# Patient Record
Sex: Male | Born: 1940 | Race: Black or African American | Hispanic: No | Marital: Married | Smoking: Former smoker
Health system: Southern US, Community
[De-identification: ages and names within clinical notes are randomized; demographics above are authoritative.]

## PROBLEM LIST (undated history)

## (undated) DIAGNOSIS — I509 Heart failure, unspecified: Secondary | ICD-10-CM

## (undated) DIAGNOSIS — I10 Essential (primary) hypertension: Secondary | ICD-10-CM

## (undated) DIAGNOSIS — N289 Disorder of kidney and ureter, unspecified: Secondary | ICD-10-CM

## (undated) DIAGNOSIS — I340 Nonrheumatic mitral (valve) insufficiency: Secondary | ICD-10-CM

## (undated) DIAGNOSIS — J449 Chronic obstructive pulmonary disease, unspecified: Secondary | ICD-10-CM

## (undated) DIAGNOSIS — Z951 Presence of aortocoronary bypass graft: Secondary | ICD-10-CM

## (undated) DIAGNOSIS — E119 Type 2 diabetes mellitus without complications: Secondary | ICD-10-CM

## (undated) DIAGNOSIS — E785 Hyperlipidemia, unspecified: Secondary | ICD-10-CM

## (undated) HISTORY — PX: AORTIC VALVE REPLACEMENT (AVR)/CORONARY ARTERY BYPASS GRAFTING (CABG): SHX5725

---
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
## 2001-02-06 DIAGNOSIS — Z951 Presence of aortocoronary bypass graft: Secondary | ICD-10-CM

## 2001-02-06 HISTORY — DX: Presence of aortocoronary bypass graft: Z95.1

## 2012-05-19 ENCOUNTER — Emergency Department: Payer: Self-pay | Admitting: Emergency Medicine

## 2012-05-19 LAB — CBC
MCH: 24.8 pg — ABNORMAL LOW (ref 26.0–34.0)
MCHC: 30.9 g/dL — ABNORMAL LOW (ref 32.0–36.0)
MCV: 80 fL (ref 80–100)
RBC: 4.69 10*6/uL (ref 4.40–5.90)

## 2012-05-19 LAB — BASIC METABOLIC PANEL
Anion Gap: 3 — ABNORMAL LOW (ref 7–16)
BUN: 14 mg/dL (ref 7–18)
Calcium, Total: 8.3 mg/dL — ABNORMAL LOW (ref 8.5–10.1)
Chloride: 108 mmol/L — ABNORMAL HIGH (ref 98–107)
Co2: 28 mmol/L (ref 21–32)
Creatinine: 0.97 mg/dL (ref 0.60–1.30)
EGFR (Non-African Amer.): >60
Glucose: 99 mg/dL (ref 65–99)
Osmolality: 278 (ref 275–301)

## 2012-05-19 LAB — PRO B NATRIURETIC PEPTIDE: B-Type Natriuretic Peptide: 1580 pg/mL — ABNORMAL HIGH (ref 0–125)

## 2012-05-19 LAB — CK TOTAL AND CKMB (NOT AT ARMC): CK-MB: <0.5 ng/mL — ABNORMAL LOW (ref 0.5–3.6)

## 2012-05-19 LAB — TROPONIN I
Troponin-I: <0.02 ng/mL
Troponin-I: 0.03 ng/mL

## 2012-05-24 LAB — CULTURE, BLOOD (SINGLE)

## 2017-06-11 DIAGNOSIS — I509 Heart failure, unspecified: Secondary | ICD-10-CM | POA: Insufficient documentation

## 2017-06-11 DIAGNOSIS — Z95810 Presence of automatic (implantable) cardiac defibrillator: Secondary | ICD-10-CM | POA: Insufficient documentation

## 2017-06-11 DIAGNOSIS — E119 Type 2 diabetes mellitus without complications: Secondary | ICD-10-CM | POA: Insufficient documentation

## 2017-06-11 DIAGNOSIS — I10 Essential (primary) hypertension: Secondary | ICD-10-CM | POA: Insufficient documentation

## 2017-06-11 DIAGNOSIS — R55 Syncope and collapse: Secondary | ICD-10-CM | POA: Insufficient documentation

## 2017-06-11 DIAGNOSIS — I951 Orthostatic hypotension: Secondary | ICD-10-CM | POA: Insufficient documentation

## 2017-09-13 ENCOUNTER — Other Ambulatory Visit: Payer: Self-pay

## 2017-09-13 ENCOUNTER — Emergency Department: Payer: No Typology Code available for payment source

## 2017-09-13 ENCOUNTER — Encounter: Payer: No Typology Code available for payment source | Attending: Internal Medicine | Admitting: *Deleted

## 2017-09-13 ENCOUNTER — Emergency Department
Admission: EM | Admit: 2017-09-13 | Discharge: 2017-09-13 | Disposition: A | Discharge status: Home / Self Care | Payer: No Typology Code available for payment source | Attending: Emergency Medicine | Admitting: Emergency Medicine

## 2017-09-13 ENCOUNTER — Encounter: Payer: Self-pay | Admitting: *Deleted

## 2017-09-13 VITALS — Ht 70.0 in | Wt 312.3 lb

## 2017-09-13 DIAGNOSIS — Z79899 Other long term (current) drug therapy: Secondary | ICD-10-CM | POA: Insufficient documentation

## 2017-09-13 DIAGNOSIS — Z794 Long term (current) use of insulin: Secondary | ICD-10-CM | POA: Diagnosis not present

## 2017-09-13 DIAGNOSIS — I509 Heart failure, unspecified: Secondary | ICD-10-CM | POA: Insufficient documentation

## 2017-09-13 DIAGNOSIS — Z7982 Long term (current) use of aspirin: Secondary | ICD-10-CM | POA: Insufficient documentation

## 2017-09-13 DIAGNOSIS — R000 Tachycardia, unspecified: Secondary | ICD-10-CM | POA: Diagnosis present

## 2017-09-13 DIAGNOSIS — E876 Hypokalemia: Secondary | ICD-10-CM | POA: Insufficient documentation

## 2017-09-13 DIAGNOSIS — G4733 Obstructive sleep apnea (adult) (pediatric): Secondary | ICD-10-CM

## 2017-09-13 DIAGNOSIS — I5022 Chronic systolic (congestive) heart failure: Secondary | ICD-10-CM

## 2017-09-13 DIAGNOSIS — Z87891 Personal history of nicotine dependence: Secondary | ICD-10-CM | POA: Diagnosis not present

## 2017-09-13 DIAGNOSIS — I472 Ventricular tachycardia: Secondary | ICD-10-CM | POA: Insufficient documentation

## 2017-09-13 DIAGNOSIS — N182 Chronic kidney disease, stage 2 (mild): Secondary | ICD-10-CM | POA: Insufficient documentation

## 2017-09-13 DIAGNOSIS — E1122 Type 2 diabetes mellitus with diabetic chronic kidney disease: Secondary | ICD-10-CM | POA: Diagnosis not present

## 2017-09-13 DIAGNOSIS — E785 Hyperlipidemia, unspecified: Secondary | ICD-10-CM

## 2017-09-13 DIAGNOSIS — I130 Hypertensive heart and chronic kidney disease with heart failure and stage 1 through stage 4 chronic kidney disease, or unspecified chronic kidney disease: Secondary | ICD-10-CM | POA: Insufficient documentation

## 2017-09-13 DIAGNOSIS — Z95810 Presence of automatic (implantable) cardiac defibrillator: Secondary | ICD-10-CM | POA: Insufficient documentation

## 2017-09-13 LAB — COMPREHENSIVE METABOLIC PANEL
ALT: 10 U/L (ref 0–44)
AST: 19 U/L (ref 15–41)
Albumin: 3.6 g/dL (ref 3.5–5.0)
Alkaline Phosphatase: 115 U/L (ref 38–126)
Anion gap: 9 (ref 5–15)
BUN: 39 mg/dL — AB (ref 8–23)
CHLORIDE: 92 mmol/L — AB (ref 98–111)
CO2: 34 mmol/L — AB (ref 22–32)
Calcium: 9.3 mg/dL (ref 8.9–10.3)
Creatinine, Ser: 2.33 mg/dL — ABNORMAL HIGH (ref 0.61–1.24)
GFR calc Af Amer: 29 mL/min — ABNORMAL LOW (ref >60)
GFR calc non Af Amer: 25 mL/min — ABNORMAL LOW (ref >60)
Glucose, Bld: 163 mg/dL — ABNORMAL HIGH (ref 70–99)
POTASSIUM: 3.2 mmol/L — AB (ref 3.5–5.1)
SODIUM: 135 mmol/L (ref 135–145)
Total Bilirubin: 0.6 mg/dL (ref 0.3–1.2)
Total Protein: 7.9 g/dL (ref 6.5–8.1)

## 2017-09-13 LAB — CBC WITH DIFFERENTIAL/PLATELET
Basophils Absolute: 0.1 10*3/uL (ref 0–0.1)
Basophils Relative: 1 %
EOS PCT: 2 %
Eosinophils Absolute: 0.2 10*3/uL (ref 0–0.7)
HCT: 36 % — ABNORMAL LOW (ref 40.0–52.0)
HEMOGLOBIN: 11.6 g/dL — AB (ref 13.0–18.0)
LYMPHS ABS: 2.5 10*3/uL (ref 1.0–3.6)
LYMPHS PCT: 23 %
MCH: 25.6 pg — AB (ref 26.0–34.0)
MCHC: 32.4 g/dL (ref 32.0–36.0)
MCV: 79 fL — AB (ref 80.0–100.0)
MONOS PCT: 6 %
Monocytes Absolute: 0.6 10*3/uL (ref 0.2–1.0)
NEUTROS PCT: 68 %
Neutro Abs: 7.6 10*3/uL — ABNORMAL HIGH (ref 1.4–6.5)
Platelets: 226 10*3/uL (ref 150–440)
RBC: 4.55 MIL/uL (ref 4.40–5.90)
RDW: 17.7 % — ABNORMAL HIGH (ref 11.5–14.5)
WBC: 11 10*3/uL — AB (ref 3.8–10.6)

## 2017-09-13 LAB — MAGNESIUM: Magnesium: 2.1 mg/dL (ref 1.7–2.4)

## 2017-09-13 LAB — TROPONIN I
TROPONIN I: 0.03 ng/mL — AB (ref <0.03)
TROPONIN I: 0.03 ng/mL — AB (ref <0.03)

## 2017-09-13 MED ORDER — AMIODARONE LOAD VIA INFUSION: 150.0000 mg | Freq: Once | INTRAVENOUS | Status: DC

## 2017-09-13 MED ORDER — MAGNESIUM SULFATE 2 GM/50ML IV SOLN
2.0000 g | Freq: Once | INTRAVENOUS | Status: AC
  Administered 2017-09-13: 2 g via INTRAVENOUS
  Filled 2017-09-13: qty 50

## 2017-09-13 MED ORDER — POTASSIUM CHLORIDE 10 MEQ/100ML IV SOLN
10.0000 meq | Freq: Once | INTRAVENOUS | Status: AC
  Administered 2017-09-13: 10 meq via INTRAVENOUS
  Filled 2017-09-13: qty 100

## 2017-09-13 MED ORDER — AMIODARONE HCL IN DEXTROSE 360-4.14 MG/200ML-% IV SOLN: 30.0000 mg/h | INTRAVENOUS | Status: DC

## 2017-09-13 MED ORDER — AMIODARONE HCL IN DEXTROSE 360-4.14 MG/200ML-% IV SOLN: 60.0000 mg/h | INTRAVENOUS | Status: DC

## 2017-09-13 MED ORDER — POTASSIUM CHLORIDE CRYS ER 20 MEQ PO TBCR
40.0000 meq | EXTENDED_RELEASE_TABLET | Freq: Once | ORAL | Status: AC
  Administered 2017-09-13: 40 meq via ORAL
  Filled 2017-09-13: qty 2

## 2017-09-13 MED ORDER — PROCAINAMIDE HCL 100 MG/ML IJ SOLN
1400.0000 mg | Freq: Once | INTRAVENOUS | Status: DC
  Filled 2017-09-13: qty 14

## 2017-09-13 NOTE — ED Notes (Signed)
Pt has a [employer] device

## 2017-09-13 NOTE — ED Notes (Signed)
Nurse started Mag at 263ml/hr per Dr. Alfred Levins. Pt in NAD at this time. Cardiac monitor in place.

## 2017-09-13 NOTE — Discharge Instructions (Addendum)
Return to the ER immediately for new, worsening, recurrent palpitations, chest pain, difficulty breathing, weakness or lightheadedness, or any other new or worsening symptoms that concern you.  You may also return at any time if you change your mind and wish to resume your treatment.  Follow-up with Dr. Humphrey Rolls tomorrow at 9 AM as planned.

## 2017-09-13 NOTE — ED Provider Notes (Signed)
[facility] Emergency Department Provider Note  ____________________________________________  Time seen: Approximately 2:49 PM  I have reviewed the triage vital signs and the nursing notes.   HISTORY  Chief Complaint Tachycardia   HPI Trevor Jennings is a 77 y.o. male with history of CHF, paroxysmal V. tach status post pacemaker/AICD, hypertension, hyperlipidemia, diabetes, OSA, CKD (creatinine baseline 2.5) who presents from cardiac rehab for tachycardia.  Patient has not taken his medications this morning.  He went to cardiac rehab and after walking 1.5 minutes on the treadmill became severely tachycardic.  He rested for several minutes with no resolution of his tachycardia and was sent to the emergency room for evaluation.  Patient denies palpitations, chest pain, dizziness, shortness of breath.  He reports feeling at baseline at this time. Has not taken meds today. Weight at baseline.   Patient Active Problem List   Diagnosis Date Noted  . Hyperlipidemia 09/13/2017  . Obstructive sleep apnea 09/13/2017  . CHF (congestive heart failure) ([place]) 06/11/2017  . Diabetes mellitus ([place]) 06/11/2017  . Hypertension 06/11/2017  . ICD (implantable cardioverter-defibrillator) in place 06/11/2017  . Orthostatic hypotension 06/11/2017  . Syncope 06/11/2017    No past surgical history on file.  Prior to Admission medications   Medication Sig Start Date End Date Taking? Authorizing Provider  aspirin EC 81 MG tablet Take by mouth.    [provider]  atorvastatin (LIPITOR) 80 MG tablet Take by mouth.    [provider]  carvedilol (COREG) 25 MG tablet Take by mouth.    [provider]  citalopram (CELEXA) 40 MG tablet Take by mouth.    [provider]  fluticasone (FLONASE) 50 MCG/ACT nasal spray 2 sprays by Each Nare route daily.    [provider]  gabapentin (NEURONTIN) 100 MG capsule Take by mouth.    [provider]  insulin glargine (LANTUS) 100 UNIT/ML injection Inject into the skin.    [provider]  insulin regular (NOVOLIN R,HUMULIN R) 100 units/mL injection Inject into the skin.    [provider]  loratadine (CLARITIN) 10 MG tablet Take 10 mg by mouth daily.    [provider]  pantoprazole (PROTONIX) 40 MG tablet Take by mouth.    [provider]  torsemide (DEMADEX) 20 MG tablet Take by mouth.    [provider]    Allergies Penicillins  No family history on file.  Social History Social History   Tobacco Use  . Smoking status: Former Smoker    Last attempt to quit: 1995    Years since quitting: 24.6  Substance Use Topics  . Alcohol use: Not on file  . Drug use: Not on file    Review of Systems  Constitutional: Negative for fever. Eyes: Negative for visual changes. ENT: Negative for sore throat. Neck: No neck pain  Cardiovascular: Negative for chest pain. + tachycardia Respiratory: Negative for shortness of breath. Gastrointestinal: Negative for abdominal pain, vomiting or diarrhea. Genitourinary: Negative for dysuria. Musculoskeletal: Negative for back pain. Skin: Negative for rash. Neurological: Negative for headaches, weakness or numbness. Psych: No SI or HI  ____________________________________________   PHYSICAL EXAM:  VITAL SIGNS: ED Triage Vitals  Enc Vitals Group     BP 09/13/17 1422 129/67     Pulse Rate 09/13/17 1422 (!) 135     Resp 09/13/17 1422 20     Temp 09/13/17 1422 98.1 F (36.7 C)     Temp Source 09/13/17 1422 Oral  SpO2 09/13/17 1422 98 %     Weight 09/13/17 1423 214 lb (97.1 kg)     Height 09/13/17 1423 5\' 10"  (1.778 m)     Head Circumference --      Peak Flow --      Pain Score 09/13/17 1423 0     Pain Loc --      Pain Edu? --      Excl. in [place]? --     Constitutional: Alert and oriented. Well appearing and in no apparent distress. HEENT:      Head: Normocephalic and  atraumatic.         Eyes: Conjunctivae are normal. Sclera is non-icteric.       Mouth/Throat: Mucous membranes are moist.       Neck: Supple with no signs of meningismus. Cardiovascular: Tachycardic with regular rate. No murmurs, gallops, or rubs. 2+ symmetrical distal pulses are present in all extremities. No JVD. Respiratory: Normal respiratory effort. Lungs are clear to auscultation bilaterally. No wheezes, crackles, or rhonchi.  Gastrointestinal: Soft, non tender, and non distended with positive bowel sounds. No rebound or guarding. Musculoskeletal: 2+ pitting edema b/l  Neurologic: Normal speech and language. Face is symmetric. Moving all extremities. No gross focal neurologic deficits are appreciated. Skin: Skin is warm, dry and intact. No rash noted. Psychiatric: Mood and affect are normal. Speech and behavior are normal.  ____________________________________________   LABS (all labs ordered are listed, but only abnormal results are displayed)  Labs Reviewed  TROPONIN I - Abnormal; Notable for the following components:      Result Value   Troponin I 0.03 (*)    All other components within normal limits  COMPREHENSIVE METABOLIC PANEL - Abnormal; Notable for the following components:   Potassium 3.2 (*)    Chloride 92 (*)    CO2 34 (*)    Glucose, Bld 163 (*)    BUN 39 (*)    Creatinine, Ser 2.33 (*)    GFR calc non Af Amer 25 (*)    GFR calc Af Amer 29 (*)    All other components within normal limits  CBC WITH DIFFERENTIAL/PLATELET - Abnormal; Notable for the following components:   WBC 11.0 (*)    Hemoglobin 11.6 (*)    HCT 36.0 (*)    MCV 79.0 (*)    MCH 25.6 (*)    RDW 17.7 (*)    Neutro Abs 7.6 (*)    All other components within normal limits  MAGNESIUM   ____________________________________________  EKG  ED ECG REPORT I, Rudene Re, the attending physician, personally viewed and interpreted this ECG.  Wide regular complex tachycardia, rate of 135,  prolonged QTC at 627, right bundle branch block, no ST elevations or depressions.  No prior for comparison.  14:59 -atrial sensed ventricular paced complex, rate of 89, prolonged QTC, right bundle branch block, no ST elevations or depressions. ____________________________________________  RADIOLOGY  I have personally reviewed the images performed during this visit and I agree with the Radiologist's read.   Interpretation by Radiologist:  Dg Chest Port 1 View  Result Date: 09/13/2017 CLINICAL DATA:  Cardiac arrhythmia EXAM: PORTABLE CHEST 1 VIEW COMPARISON:  May 19, 2012 FINDINGS: There is no appreciable edema or consolidation. There is cardiomegaly with pulmonary vascularity normal. Pacemaker leads are attached to the right atrium, right ventricle, and coronary sinus. No adenopathy. No bone lesions. IMPRESSION: Stable cardiac enlargement. Stable appearing pacemaker lead placements. No edema or consolidation. Electronically Signed   By:  Lowella Grip III M.D.   On: 09/13/2017 15:17    ____________________________________________   PROCEDURES  Procedure(s) performed: None Procedures Critical Care performed:  None ____________________________________________   INITIAL IMPRESSION / ASSESSMENT AND PLAN / ED COURSE  76 y.o. male with history of CHF, paroxysmal V. tach status post pacemaker/AICD, hypertension, hyperlipidemia, diabetes, OSA, CKD (creatinine baseline 2.5) who presents from cardiac rehab for tachycardia.  Patient arrives in the emergency department hemodynamically stable with initial EKG showing wide regular complex tachycardia.  [facility]  According to records that he brought with him patient has a history of nonsustained V. Tach and SVT.  We do not have an old EKG for him.  Patient was started on IV magnesium and converted to his normal rhythm showing a right bundle branch block and a paced rhythm. Since patient seems to have a RBBB at baseline, his initial  EKG could also be consistent with SVT with aberrancy which would explain why patient's defibrillator did not shock him. Pacemaker will be interrogated. Labs showing mild hypokalemia with K of 3.2 which will be supplemented p.o. and IV.  Creatinine of 2.33 which is within patient's baseline of 2.5.  Chest x-ray showed no acute findings.  I discussed patient with Dr. Humphrey Rolls, cardiologist on-call who evaluated patient in the emergency room and recommended outpatient follow-up tomorrow 9 AM in his office as long as patient remains in his normal rhythm and the labs did not show any severe abnormalities. Will get a 2nd troponin at 1740 and reassess. Care transferred to Dr. Cherylann Banas.      As part of my medical decision making, I reviewed the following data within the [place] notes reviewed and incorporated, Labs reviewed , EKG interpreted , Old chart reviewed, Radiograph reviewed , A consult was requested and obtained from this/these consultant(s) cardiology, Notes from prior ED visits and [place] Controlled Substance Database    Pertinent labs & imaging results that were available during my care of the patient were reviewed by me and considered in my medical decision making (see chart for details).    ____________________________________________   FINAL CLINICAL IMPRESSION(S) / ED DIAGNOSES  Final diagnoses:  Wide-complex tachycardia ([place])  Hypokalemia      NEW MEDICATIONS STARTED DURING THIS VISIT:  ED Discharge Orders    None       Note:  This document was prepared using Dragon voice recognition software and may include unintentional dictation errors.    Alfred Levins, [place], MD 09/13/17 [phone]

## 2017-09-13 NOTE — ED Provider Notes (Signed)
-----------------------------------------   6:21 PM on 09/13/2017 -----------------------------------------  I took over care on this patient from Dr. Alfred Levins.  The patient was being evaluated for arrhythmia and tachycardia.  The plan was repeat troponin, and if the patient continued to have a stable heart rate and be asymptomatic, he would be discharged home with follow-up with Dr. Humphrey Rolls tomorrow.  The repeat troponin is stable, and the patient remains asymptomatic, however he did have a recurrence of the tachycardia.  Repeat EKG at this time shows slightly irregular tachycardia in the 130s with slightly wide QRS.  On reassessment, the patient continues to report no symptoms.  I talked to Dr. Humphrey Rolls from cardiology once again.  He recommended to place the patient on amiodarone drip and admit him for observation overnight, with a plan to likely send him home on p.o. amiodarone tomorrow.  However, when I offered this to the patient, he stated that he would strongly prefer to go home.  The patient states that he is asymptomatic and feels fine, and would prefer to go home and follow-up tomorrow.  He would like to leave AMA.  I explained the results of the work-up, the concern for acute arrhythmia, and the risks including but not limited to possible permanent damage to the heart, syncope, death, or permanent disability.  Patient was able to paraphrase these risks back to me and demonstrates appropriate understanding.  He demonstrates full decision-making capacity.  He also understands he may return at any time he changes his mind or has any new or worsening symptoms.  He agrees to follow-up with Dr. Humphrey Rolls tomorrow.  ED ECG REPORT I, Arta Silence, the attending physician, personally viewed and interpreted this ECG.  Date: 09/13/2017 EKG Time: 1738 Rate: 131 Rhythm: Atrial tachycardia QRS Axis: normal Intervals: RBBB ST/T Wave abnormalities: normal Narrative Interpretation: No specific atrial  arrhythmia with RBBB    Arta Silence, MD 09/13/17 1826

## 2017-09-13 NOTE — ED Triage Notes (Signed)
PT arrived from [facility] for uncontrolled heart rate. Pt denies any pain in triage room.

## 2017-09-13 NOTE — Patient Instructions (Signed)
Patient Instructions  Patient Details  Name: Trevor Jennings MRN: [id number] Date of Birth: Jun 30, 1940 Referring Provider:  [facility]  Below are your personal goals for exercise, nutrition, and risk factors. Our goal is to help you stay on track towards obtaining and maintaining these goals. We will be discussing your progress on these goals with you throughout the program.  Initial Exercise Prescription: Initial Exercise Prescription - 09/13/17 1400      Date of Initial Exercise RX and Referring Provider   Date  09/13/17    [facility] Provider  [facility]      Treadmill   MPH  0.8    Grade  0    Minutes  15    METs  1      T5 Nustep   Level  1    SPM  60    Minutes  15      Biostep-RELP   Level  1    SPM  50    Minutes  15    METs  1      Track   Laps  10    Minutes  15    METs  1      Prescription Details   Frequency (times per week)  3    Duration  Progress to 45 minutes of aerobic exercise without signs/symptoms of physical distress      Intensity   THRR 40-80% of Max Heartrate  110-132    Ratings of Perceived Exertion  11-13    Perceived Dyspnea  0-4      Resistance Training   Training Prescription  Yes    Weight  3 lb    Reps  10-15       Exercise Goals: Frequency: Be able to perform aerobic exercise two to three times per week in program working toward 2-5 days per week of home exercise.  Intensity: Work with a perceived exertion of 11 (fairly light) - 15 (hard) while following your exercise prescription.  We will make changes to your prescription with you as you progress through the program.   Duration: Be able to do 30 to 45 minutes of continuous aerobic exercise in addition to a 5 minute warm-up and a 5 minute cool-down routine.   Nutrition Goals: Your personal nutrition goals will be established when you do your nutrition analysis with the dietician.  The following are general nutrition guidelines to follow: Cholesterol < 200mg /day Sodium <  1500mg /day Fiber: Men over 50 yrs - 30 grams per day  Personal Goals: Personal Goals and Risk Factors at Admission - 09/13/17 1446      Core Components/Risk Factors/Patient Goals on Admission    Weight Management  Yes;Obesity;Weight Loss    Intervention  Weight Management: Develop a combined nutrition and exercise program designed to reach desired caloric intake, while maintaining appropriate intake of nutrient and fiber, sodium and fats, and appropriate energy expenditure required for the weight goal.;Weight Management: Provide education and appropriate resources to help participant work on and attain dietary goals.;Weight Management/Obesity: Establish reasonable short term and long term weight goals.;Obesity: Provide education and appropriate resources to help participant work on and attain dietary goals.    Admit Weight  312 lb (141.5 kg)    Goal Weight: Short Term  308 lb (139.7 kg)    Goal Weight: Long Term  270 lb (122.5 kg)    Expected Outcomes  Short Term: Continue to assess and modify interventions until short term weight is achieved;Long Term: Adherence to  nutrition and physical activity/exercise program aimed toward attainment of established weight goal;Weight Loss: Understanding of general recommendations for a balanced deficit meal plan, which promotes 1-2 lb weight loss per week and includes a negative energy balance of [phone] kcal/d;Understanding recommendations for meals to include 15-35% energy as protein, 25-35% energy from fat, 35-60% energy from carbohydrates, less than 200mg  of dietary cholesterol, 20-35 gm of total fiber daily;Understanding of distribution of calorie intake throughout the day with the consumption of 4-5 meals/snacks    Diabetes  Yes    Intervention  Provide education about signs/symptoms and action to take for hypo/hyperglycemia.;Provide education about proper nutrition, including hydration, and aerobic/resistive exercise prescription along with prescribed  medications to achieve blood glucose in normal ranges: Fasting glucose 65-99 mg/dL    Expected Outcomes  Short Term: Participant verbalizes understanding of the signs/symptoms and immediate care of hyper/hypoglycemia, proper foot care and importance of medication, aerobic/resistive exercise and nutrition plan for blood glucose control.;Long Term: Attainment of HbA1C < 7%.    Heart Failure  Yes    Intervention  Provide a combined exercise and nutrition program that is supplemented with education, support and counseling about heart failure. Directed toward relieving symptoms such as shortness of breath, decreased exercise tolerance, and extremity edema.    Expected Outcomes  Improve functional capacity of life;Short term: Attendance in program 2-3 days a week with increased exercise capacity. Reported lower sodium intake. Reported increased fruit and vegetable intake. Reports medication compliance.;Short term: Daily weights obtained and reported for increase. Utilizing diuretic protocols set by physician.;Long term: Adoption of self-care skills and reduction of barriers for early signs and symptoms recognition and intervention leading to self-care maintenance.    Hypertension  Yes    Intervention  Provide education on lifestyle modifcations including regular physical activity/exercise, weight management, moderate sodium restriction and increased consumption of fresh fruit, vegetables, and low fat dairy, alcohol moderation, and smoking cessation.;Monitor prescription use compliance.    Expected Outcomes  Short Term: Continued assessment and intervention until BP is < 140/42mm HG in hypertensive participants. < 130/79mm HG in hypertensive participants with diabetes, heart failure or chronic kidney disease.;Long Term: Maintenance of blood pressure at goal levels.    Lipids  Yes    Intervention  Provide education and support for participant on nutrition & aerobic/resistive exercise along with prescribed  medications to achieve LDL 70mg , HDL >40mg .    Expected Outcomes  Short Term: Participant states understanding of desired cholesterol values and is compliant with medications prescribed. Participant is following exercise prescription and nutrition guidelines.;Long Term: Cholesterol controlled with medications as prescribed, with individualized exercise RX and with personalized nutrition plan. Value goals: LDL < 70mg , HDL > 40 mg.       Tobacco Use Initial Evaluation: Social History   Tobacco Use  Smoking Status Former Smoker  . Last attempt to quit: 1995  . Years since quitting: 24.6    Exercise Goals and Review: Exercise Goals    Row Name 09/13/17 1425             Exercise Goals   Increase Physical Activity  Yes       Intervention  Provide advice, education, support and counseling about physical activity/exercise needs.;Develop an individualized exercise prescription for aerobic and resistive training based on initial evaluation findings, risk stratification, comorbidities and participant's personal goals.       Expected Outcomes  Short Term: Attend rehab on a regular basis to increase amount of physical activity.;Long Term: Add in home exercise to  make exercise part of routine and to increase amount of physical activity.;Long Term: Exercising regularly at least 3-5 days a week.       Increase Strength and Stamina  Yes       Intervention  Provide advice, education, support and counseling about physical activity/exercise needs.;Develop an individualized exercise prescription for aerobic and resistive training based on initial evaluation findings, risk stratification, comorbidities and participant's personal goals.       Expected Outcomes  Short Term: Increase workloads from initial exercise prescription for resistance, speed, and METs.;Short Term: Perform resistance training exercises routinely during rehab and add in resistance training at home;Long Term: Improve cardiorespiratory  fitness, muscular endurance and strength as measured by increased METs and functional capacity (6MWT)       Able to understand and use rate of perceived exertion (RPE) scale  Yes       Intervention  Provide education and explanation on how to use RPE scale       Expected Outcomes  Short Term: Able to use RPE daily in rehab to express subjective intensity level;Long Term:  Able to use RPE to guide intensity level when exercising independently       Able to understand and use Dyspnea scale  Yes       Intervention  Provide education and explanation on how to use Dyspnea scale       Expected Outcomes  Short Term: Able to use Dyspnea scale daily in rehab to express subjective sense of shortness of breath during exertion;Long Term: Able to use Dyspnea scale to guide intensity level when exercising independently       Knowledge and understanding of Target Heart Rate Range (THRR)  Yes       Intervention  Provide education and explanation of THRR including how the numbers were predicted and where they are located for reference       Expected Outcomes  Short Term: Able to state/look up THRR;Long Term: Able to use THRR to govern intensity when exercising independently;Short Term: Able to use daily as guideline for intensity in rehab       Able to check pulse independently  Yes       Intervention  Provide education and demonstration on how to check pulse in carotid and radial arteries.;Review the importance of being able to check your own pulse for safety during independent exercise       Expected Outcomes  Short Term: Able to explain why pulse checking is important during independent exercise;Long Term: Able to check pulse independently and accurately       Understanding of Exercise Prescription  Yes       Intervention  Provide education, explanation, and written materials on patient's individual exercise prescription       Expected Outcomes  Short Term: Able to explain program exercise prescription;Long Term:  Able to explain home exercise prescription to exercise independently          Copy of goals given to participant.

## 2017-09-13 NOTE — Progress Notes (Signed)
Daily Session Note  Patient Details  Name: Trevor Jennings MRN: [id number] Date of Birth: November 20, 1940 Referring Provider:     Cardiac Rehab from 09/13/2017 in [facility]  [facility]  [facility]      Encounter Date: 09/13/2017  Check In: Session Check In - 09/13/17 1439      Check-In   Supervising physician immediately available to respond to emergencies  See telemetry face sheet for immediately available ER MD    Location  [facility]-Cardiac & Pulmonary Rehab    Staff Present  Nada Maclachlan, BA, ACSM CEP, Exercise Physiologist;Kassondra Geil Sherryll Burger, RN BSN    Tobacco Cessation  No Change    Warm-up and Cool-down  Not performed (comment)    Resistance Training Performed  Yes    VAD Patient?  No    PAD/SET Patient?  No      Pain Assessment   Currently in Pain?  No/denies        Exercise Prescription Changes - 09/13/17 1400      Response to Exercise   Blood Pressure (Admit)  110/64    Blood Pressure (Exercise)  124/60    Blood Pressure (Exit)  110/56    Heart Rate (Admit)  81 bpm    Heart Rate (Exercise)  144 bpm    Heart Rate (Exit)  133 bpm    Oxygen Saturation (Admit)  96 %    Oxygen Saturation (Exercise)  98 %    Oxygen Saturation (Exit)  97 %    Rating of Perceived Exertion (Exercise)  13    Perceived Dyspnea (Exercise)  2       Social History   Tobacco Use  Smoking Status Former Smoker  . Last attempt to quit: 1995  . Years since quitting: 24.6    Goals Met:  Proper associated with RPD/PD & O2 Sat Personal goals reviewed Strength training completed today  Goals Unmet:  Not Applicable  Comments: Med Review completed.    Dr. Emily Filbert is Medical Director for [facility] and [facility].

## 2017-09-13 NOTE — ED Notes (Signed)
2nd Troponin sent to lab.

## 2017-09-13 NOTE — Consult Note (Signed)
Quan Cybulski is a 77 y.o. male  [id number]  Primary Cardiologist: Oney Folz Reason for Consultation: wide complex tachycardia  HPI: [id number] came for evaluation sent from cardiac rehab as had Hear rate 135. He was given Magnesium IV and now is pacing atrial sensed at 70/min.   Review of Systems: Denis chest pain or SOB.   No past medical history on file.   (Not in a hospital admission)   . potassium chloride  40 mEq Oral Once    Infusions: . potassium chloride    . procainamide (PRONESTYL) BOLUS IVPB      Allergies  Allergen Reactions  . Penicillins     Social History   Socioeconomic History  . Marital status: Married    Spouse name: Not on file  . Number of children: Not on file  . Years of education: Not on file  . Highest education level: Not on file  Occupational History  . Not on file  Social Needs  . Financial resource strain: Not on file  . Food insecurity:    Worry: Not on file    Inability: Not on file  . Transportation needs:    Medical: Not on file    Non-medical: Not on file  Tobacco Use  . Smoking status: Former Smoker    Last attempt to quit: 1995    Years since quitting: 24.6  Substance and Sexual Activity  . Alcohol use: Not on file  . Drug use: Not on file  . Sexual activity: Not on file  Lifestyle  . Physical activity:    Days per week: Not on file    Minutes per session: Not on file  . Stress: Not on file  Relationships  . Social connections:    Talks on phone: Not on file    Gets together: Not on file    Attends religious service: Not on file    Active member of club or organization: Not on file    Attends meetings of clubs or organizations: Not on file    Relationship status: Not on file  . Intimate partner violence:    Fear of current or ex partner: Not on file    Emotionally abused: Not on file    Physically abused: Not on file    Forced sexual activity: Not on file  Other Topics Concern  . Not on file  Social  History Narrative  . Not on file    No family history on file.  PHYSICAL EXAM: Vitals:   09/13/17 1500 09/13/17 1530  BP: 115/64 121/78  Pulse: 79 79  Resp: (!) 8 18  Temp:    SpO2: 99% (!) 87%    No intake or output data in the 24 hours ending 09/13/17 1540  General:  Well appearing. No respiratory difficulty HEENT: normal Neck: supple. no JVD. Carotids 2+ bilat; no bruits. No lymphadenopathy or thryomegaly appreciated. Cor: PMI nondisplaced. Regular rate & rhythm. No rubs, gallops or murmurs. Lungs: clear Abdomen: soft, nontender, nondistended. No hepatosplenomegaly. No bruits or masses. Good bowel sounds. Extremities: no cyanosis, clubbing, rash, edema Neuro: alert & oriented x 3, cranial nerves grossly intact. moves all 4 extremities w/o difficulty. Affect pleasant.  ECG: wide complex rhythm 135/min appears to be probably vtach  Results for orders placed or performed during the hospital encounter of 09/13/17 (from the past 24 hour(s))  Troponin I     Status: Abnormal   Collection Time: 09/13/17  2:41 PM  Result Value Ref Range  Troponin I 0.03 (HH) <0.03 ng/mL  Comprehensive metabolic panel     Status: Abnormal   Collection Time: 09/13/17  2:41 PM  Result Value Ref Range   Sodium 135 135 - 145 mmol/L   Potassium 3.2 (L) 3.5 - 5.1 mmol/L   Chloride 92 (L) 98 - 111 mmol/L   CO2 34 (H) 22 - 32 mmol/L   Glucose, Bld 163 (H) 70 - 99 mg/dL   BUN 39 (H) 8 - 23 mg/dL   Creatinine, Ser 2.33 (H) 0.61 - 1.24 mg/dL   Calcium 9.3 8.9 - 10.3 mg/dL   Total Protein 7.9 6.5 - 8.1 g/dL   Albumin 3.6 3.5 - 5.0 g/dL   AST 19 15 - 41 U/L   ALT 10 0 - 44 U/L   Alkaline Phosphatase 115 38 - 126 U/L   Total Bilirubin 0.6 0.3 - 1.2 mg/dL   GFR calc non Af Amer 25 (L) >60 mL/min   GFR calc Af Amer 29 (L) >60 mL/min   Anion gap 9 5 - 15  CBC with Differential     Status: Abnormal   Collection Time: 09/13/17  2:41 PM  Result Value Ref Range   WBC 11.0 (H) 3.8 - 10.6 K/uL   RBC 4.55  4.40 - 5.90 MIL/uL   Hemoglobin 11.6 (L) 13.0 - 18.0 g/dL   HCT 36.0 (L) 40.0 - 52.0 %   MCV 79.0 (L) 80.0 - 100.0 fL   MCH 25.6 (L) 26.0 - 34.0 pg   MCHC 32.4 32.0 - 36.0 g/dL   RDW 17.7 (H) 11.5 - 14.5 %   Platelets 226 150 - 440 K/uL   Neutrophils Relative % 68 %   Neutro Abs 7.6 (H) 1.4 - 6.5 K/uL   Lymphocytes Relative 23 %   Lymphs Abs 2.5 1.0 - 3.6 K/uL   Monocytes Relative 6 %   Monocytes Absolute 0.6 0.2 - 1.0 K/uL   Eosinophils Relative 2 %   Eosinophils Absolute 0.2 0 - 0.7 K/uL   Basophils Relative 1 %   Basophils Absolute 0.1 0 - 0.1 K/uL   Dg Chest Port 1 View  Result Date: 09/13/2017 CLINICAL DATA:  Cardiac arrhythmia EXAM: PORTABLE CHEST 1 VIEW COMPARISON:  May 19, 2012 FINDINGS: There is no appreciable edema or consolidation. There is cardiomegaly with pulmonary vascularity normal. Pacemaker leads are attached to the right atrium, right ventricle, and coronary sinus. No adenopathy. No bone lesions. IMPRESSION: Stable cardiac enlargement. Stable appearing pacemaker lead placements. No edema or consolidation. Electronically Signed   By: Lowella Grip III M.D.   On: 09/13/2017 15:17     ASSESSMENT AND PLAN: Has wide complex rhythm, but has PPM and AICD, and can go home with f/u tomorrow at 9 am since is asymptomatic.  Ketra Duchesne A

## 2017-09-13 NOTE — Progress Notes (Signed)
Cardiac Individual Treatment Plan  Patient Details  Name: Trevor Jennings MRN: [id number] Date of Birth: 25-Feb-1940 Referring Provider:     Cardiac Rehab from 09/13/2017 in [facility]  [facility]      Initial Encounter Date:    Cardiac Rehab from 09/13/2017 in [facility] Cardiac and Pulmonary Rehab  Date  09/13/17      Visit Diagnosis: Heart failure, chronic systolic (HCC)  Hyperlipidemia, unspecified hyperlipidemia type  Obstructive sleep apnea  Patient's Home Medications on Admission: No current facility-administered medications for this visit.   Current Outpatient Medications:  .  aspirin EC 81 MG tablet, Take by mouth., Disp: , Rfl:  .  atorvastatin (LIPITOR) 80 MG tablet, Take by mouth., Disp: , Rfl:  .  carvedilol (COREG) 25 MG tablet, Take by mouth., Disp: , Rfl:  .  citalopram (CELEXA) 40 MG tablet, Take by mouth., Disp: , Rfl:  .  fluticasone (FLONASE) 50 MCG/ACT nasal spray, 2 sprays by Each Nare route daily., Disp: , Rfl:  .  gabapentin (NEURONTIN) 100 MG capsule, Take by mouth., Disp: , Rfl:  .  insulin glargine (LANTUS) 100 UNIT/ML injection, Inject into the skin., Disp: , Rfl:  .  insulin regular (NOVOLIN R,HUMULIN R) 100 units/mL injection, Inject into the skin., Disp: , Rfl:  .  loratadine (CLARITIN) 10 MG tablet, Take 10 mg by mouth daily., Disp: , Rfl:  .  pantoprazole (PROTONIX) 40 MG tablet, Take by mouth., Disp: , Rfl:  .  torsemide (DEMADEX) 20 MG tablet, Take by mouth., Disp: , Rfl:   Facility-Administered Medications Ordered in Other Visits:  .  magnesium sulfate IVPB 2 g 50 mL, 2 g, Intravenous, Once, Alfred Levins, [place], MD, Last Rate: 50 mL/hr at 09/13/17 1449, 2 g at 09/13/17 1449 .  procainamide (PRONESTYL) 1,400 mg in dextrose 5 % 350 mL IVPB, 1,400 mg, Intravenous, Once, Alfred Levins, [place], MD  Past Medical History: History reviewed. No pertinent past medical history.  Tobacco Use: Social History   Tobacco Use   Smoking Status Former Smoker  . Last attempt to quit: 1995  . Years since quitting: 24.6    Labs: Recent Review Flowsheet Data    There is no flowsheet data to display.       Exercise Target Goals: Date: 09/13/17  Exercise Program Goal: Individual exercise prescription set using results from initial 6 min walk test and THRR while considering  patient's activity barriers and safety.   Exercise Prescription Goal: Initial exercise prescription builds to 30-45 minutes a day of aerobic activity, 2-3 days per week.  Home exercise guidelines will be given to patient during program as part of exercise prescription that the participant will acknowledge.  Activity Barriers & Risk Stratification: Activity Barriers & Cardiac Risk Stratification - 09/13/17 1455      Activity Barriers & Cardiac Risk Stratification   Activity Barriers  Back Problems;Shortness of Breath    Cardiac Risk Stratification  High       6 Minute Walk: 6 Minute Walk    Row Name 09/13/17 1426         6 Minute Walk   Distance  200 feet     Walk Time  2 minutes     MPH  1.13     RPE  13     Perceived Dyspnea   2     Symptoms  Yes (comment)     Comments  short of breath - stopped at 2:00  Resting HR  88 bpm     Resting BP  110/64     Resting Oxygen Saturation   94 %     Exercise Oxygen Saturation  during 6 min walk  97 %     Max Ex. HR  144 bpm     Max Ex. BP  124/60     2 Minute Post BP  110/56        Oxygen Initial Assessment:   Oxygen Re-Evaluation:   Oxygen Discharge (Final Oxygen Re-Evaluation):   Initial Exercise Prescription: Initial Exercise Prescription - 09/13/17 1400      Date of Initial Exercise RX and Referring Provider   Date  09/13/17    [facility]      Treadmill   MPH  0.8    Grade  0    Minutes  15    METs  1      T5 Nustep   Level  1    SPM  60    Minutes  15      Biostep-RELP   Level  1    SPM  50    Minutes  15    METs  1      Track    Laps  10    Minutes  15    METs  1      Prescription Details   Frequency (times per week)  3    Duration  Progress to 45 minutes of aerobic exercise without signs/symptoms of physical distress      Intensity   THRR 40-80% of Max Heartrate  110-132    Ratings of Perceived Exertion  11-13    Perceived Dyspnea  0-4      Resistance Training   Training Prescription  Yes    Weight  3 lb    Reps  10-15       Perform Capillary Blood Glucose checks as needed.  Exercise Prescription Changes: Exercise Prescription Changes    Row Name 09/13/17 1400             Response to Exercise   Blood Pressure (Admit)  110/64       Blood Pressure (Exercise)  124/60       Blood Pressure (Exit)  110/56       Heart Rate (Admit)  81 bpm       Heart Rate (Exercise)  144 bpm       Heart Rate (Exit)  133 bpm       Oxygen Saturation (Admit)  96 %       Oxygen Saturation (Exercise)  98 %       Oxygen Saturation (Exit)  97 %       Rating of Perceived Exertion (Exercise)  13       Perceived Dyspnea (Exercise)  2          Exercise Comments:   Exercise Goals and Review: Exercise Goals    Row Name 09/13/17 1425             Exercise Goals   Increase Physical Activity  Yes       Intervention  Provide advice, education, support and counseling about physical activity/exercise needs.;Develop an individualized exercise prescription for aerobic and resistive training based on initial evaluation findings, risk stratification, comorbidities and participant's personal goals.       Expected Outcomes  Short Term: Attend rehab on a regular basis to increase amount of physical activity.;Long Term: Add in home  exercise to make exercise part of routine and to increase amount of physical activity.;Long Term: Exercising regularly at least 3-5 days a week.       Increase Strength and Stamina  Yes       Intervention  Provide advice, education, support and counseling about physical activity/exercise needs.;Develop  an individualized exercise prescription for aerobic and resistive training based on initial evaluation findings, risk stratification, comorbidities and participant's personal goals.       Expected Outcomes  Short Term: Increase workloads from initial exercise prescription for resistance, speed, and METs.;Short Term: Perform resistance training exercises routinely during rehab and add in resistance training at home;Long Term: Improve cardiorespiratory fitness, muscular endurance and strength as measured by increased METs and functional capacity (6MWT)       Able to understand and use rate of perceived exertion (RPE) scale  Yes       Intervention  Provide education and explanation on how to use RPE scale       Expected Outcomes  Short Term: Able to use RPE daily in rehab to express subjective intensity level;Long Term:  Able to use RPE to guide intensity level when exercising independently       Able to understand and use Dyspnea scale  Yes       Intervention  Provide education and explanation on how to use Dyspnea scale       Expected Outcomes  Short Term: Able to use Dyspnea scale daily in rehab to express subjective sense of shortness of breath during exertion;Long Term: Able to use Dyspnea scale to guide intensity level when exercising independently       Knowledge and understanding of Target Heart Rate Range (THRR)  Yes       Intervention  Provide education and explanation of THRR including how the numbers were predicted and where they are located for reference       Expected Outcomes  Short Term: Able to state/look up THRR;Long Term: Able to use THRR to govern intensity when exercising independently;Short Term: Able to use daily as guideline for intensity in rehab       Able to check pulse independently  Yes       Intervention  Provide education and demonstration on how to check pulse in carotid and radial arteries.;Review the importance of being able to check your own pulse for safety during  independent exercise       Expected Outcomes  Short Term: Able to explain why pulse checking is important during independent exercise;Long Term: Able to check pulse independently and accurately       Understanding of Exercise Prescription  Yes       Intervention  Provide education, explanation, and written materials on patient's individual exercise prescription       Expected Outcomes  Short Term: Able to explain program exercise prescription;Long Term: Able to explain home exercise prescription to exercise independently          Exercise Goals Re-Evaluation :   Discharge Exercise Prescription (Final Exercise Prescription Changes): Exercise Prescription Changes - 09/13/17 1400      Response to Exercise   Blood Pressure (Admit)  110/64    Blood Pressure (Exercise)  124/60    Blood Pressure (Exit)  110/56    Heart Rate (Admit)  81 bpm    Heart Rate (Exercise)  144 bpm    Heart Rate (Exit)  133 bpm    Oxygen Saturation (Admit)  96 %    Oxygen Saturation (Exercise)  98 %    Oxygen Saturation (Exit)  97 %    Rating of Perceived Exertion (Exercise)  13    Perceived Dyspnea (Exercise)  2       Nutrition:  Target Goals: Understanding of nutrition guidelines, daily intake of sodium <1533m, cholesterol <2031m calories 30% from fat and 7% or less from saturated fats, daily to have 5 or more servings of fruits and vegetables.  Biometrics: Pre Biometrics - 09/13/17 1424      Pre Biometrics   Height  _0  (1.778 m)    Weight  (!) 312 lb 4.8 oz (141.7 kg)    Waist Circumference  55 inches    Hip Circumference  54 inches    Waist to Hip Ratio  1.02 %    BMI (Calculated)  44.81        Nutrition Therapy Plan and Nutrition Goals: Nutrition Therapy & Goals - 09/13/17 1445      Intervention Plan   Intervention  Prescribe, educate and counsel regarding individualized specific dietary modifications aiming towards targeted core components such as weight, hypertension, lipid management,  diabetes, heart failure and other comorbidities.;Nutrition handout(s) given to patient.    Expected Outcomes  Short Term Goal: Understand basic principles of dietary content, such as calories, fat, sodium, cholesterol and nutrients.;Long Term Goal: Adherence to prescribed nutrition plan.;Short Term Goal: A plan has been developed with personal nutrition goals set during dietitian appointment.       Nutrition Assessments: Nutrition Assessments - 09/13/17 1445      MEDFICTS Scores   Pre Score  58       Nutrition Goals Re-Evaluation:   Nutrition Goals Discharge (Final Nutrition Goals Re-Evaluation):   Psychosocial: Target Goals: Acknowledge presence or absence of significant depression and/or stress, maximize coping skills, provide positive support system. Participant is able to verbalize types and ability to use techniques and skills needed for reducing stress and depression.   Initial Review & Psychosocial Screening: Initial Psych Review & Screening - 09/13/17 1443      Initial Review   Current issues with  Current Stress Concerns    Source of Stress Concerns  Chronic Illness;Unable to perform yard/household activities;Unable to participate in former interests or hobbies      [place] Yes      Barriers   Psychosocial barriers to participate in program  There are no identifiable barriers or psychosocial needs.;The patient should benefit from training in stress management and relaxation.      Screening Interventions   Interventions  Encouraged to exercise;Program counselor consult;To provide support and resources with identified psychosocial needs;Provide feedback about the scores to participant    Expected Outcomes  Short Term goal: Utilizing psychosocial counselor, staff and physician to assist with identification of specific Stressors or current issues interfering with healing process. Setting desired goal for each stressor or current issue  identified.;Long Term Goal: Stressors or current issues are controlled or eliminated.;Short Term goal: Identification and review with participant of any Quality of Life or Depression concerns found by scoring the questionnaire.;Long Term goal: The participant improves quality of Life and PHQ9 Scores as seen by post scores and/or verbalization of changes       Quality of Life Scores:  Quality of Life - 09/13/17 1444      Quality of Life   Select  Quality of Life      Quality of Life Scores   Health/Function Pre  12.29 %  Socioeconomic Pre  19.83 %    Psych/Spiritual Pre  25.57 %    Family Pre  19.3 %    GLOBAL Pre  17.7 %      Scores of 19 and below usually indicate a poorer quality of life in these areas.  A difference of  2-3 points is a clinically meaningful difference.  A difference of 2-3 points in the total score of the Quality of Life Index has been associated with significant improvement in overall quality of life, self-image, physical symptoms, and general health in studies assessing change in quality of life.  PHQ-9: Recent Review Flowsheet Data    Depression screen [facility] 2/9 09/13/2017   Decreased Interest 2   Down, Depressed, Hopeless 2   PHQ - 2 Score 4   Altered sleeping 2   Tired, decreased energy 3   Change in appetite 0   Feeling bad or failure about yourself  0   Trouble concentrating 2   Moving slowly or fidgety/restless 0   Suicidal thoughts 0   PHQ-9 Score 11   Difficult doing work/chores Somewhat difficult     Interpretation of Total Score  Total Score Depression Severity:  1-4 = Minimal depression, 5-9 = Mild depression, 10-14 = Moderate depression, 15-19 = Moderately severe depression, 20-27 = Severe depression   Psychosocial Evaluation and Intervention:   Psychosocial Re-Evaluation:   Psychosocial Discharge (Final Psychosocial Re-Evaluation):   Vocational Rehabilitation: Provide vocational rehab assistance to qualifying candidates.    Vocational Rehab Evaluation & Intervention: Vocational Rehab - 09/13/17 1446      Initial Vocational Rehab Evaluation & Intervention   Assessment shows need for Vocational Rehabilitation  No       Education: Education Goals: Education classes will be provided on a variety of topics geared toward better understanding of heart health and risk factor modification. Participant will state understanding/return demonstration of topics presented as noted by education test scores.  Learning Barriers/Preferences: Learning Barriers/Preferences - 09/13/17 1445      Learning Barriers/Preferences   Learning Barriers  Hearing    Learning Preferences  Computer/Internet;Individual Instruction       Education Topics:  AED/CPR: - Group verbal and written instruction with the use of models to demonstrate the basic use of the AED with the basic ABC's of resuscitation.   General Nutrition Guidelines/Fats and Fiber: -Group instruction provided by verbal, written material, models and posters to present the general guidelines for heart healthy nutrition. Gives an explanation and review of dietary fats and fiber.   Controlling Sodium/Reading Food Labels: -Group verbal and written material supporting the discussion of sodium use in heart healthy nutrition. Review and explanation with models, verbal and written materials for utilization of the food label.   Exercise Physiology & General Exercise Guidelines: - Group verbal and written instruction with models to review the exercise physiology of the cardiovascular system and associated critical values. Provides general exercise guidelines with specific guidelines to those with heart or lung disease.    Aerobic Exercise & Resistance Training: - Gives group verbal and written instruction on the various components of exercise. Focuses on aerobic and resistive training programs and the benefits of this training and how to safely progress through these  programs..   Flexibility, Balance, Mind/Body Relaxation: Provides group verbal/written instruction on the benefits of flexibility and balance training, including mind/body exercise modes such as yoga, pilates and tai chi.  Demonstration and skill practice provided.   Stress and Anxiety: - Provides group verbal and written instruction  about the health risks of elevated stress and causes of high stress.  Discuss the correlation between heart/lung disease and anxiety and treatment options. Review healthy ways to manage with stress and anxiety.   Depression: - Provides group verbal and written instruction on the correlation between heart/lung disease and depressed mood, treatment options, and the stigmas associated with seeking treatment.   Anatomy & Physiology of the Heart: - Group verbal and written instruction and models provide basic cardiac anatomy and physiology, with the coronary electrical and arterial systems. Review of Valvular disease and Heart Failure   Cardiac Procedures: - Group verbal and written instruction to review commonly prescribed medications for heart disease. Reviews the medication, class of the drug, and side effects. Includes the steps to properly store meds and maintain the prescription regimen. (beta blockers and nitrates)   Cardiac Medications I: - Group verbal and written instruction to review commonly prescribed medications for heart disease. Reviews the medication, class of the drug, and side effects. Includes the steps to properly store meds and maintain the prescription regimen.   Cardiac Medications II: -Group verbal and written instruction to review commonly prescribed medications for heart disease. Reviews the medication, class of the drug, and side effects. (all other drug classes)    Go Sex-Intimacy & Heart Disease, Get SMART - Goal Setting: - Group verbal and written instruction through game format to discuss heart disease and the return to sexual  intimacy. Provides group verbal and written material to discuss and apply goal setting through the application of the S.M.A.R.T. Method.   Other Matters of the Heart: - Provides group verbal, written materials and models to describe Stable Angina and Peripheral Artery. Includes description of the disease process and treatment options available to the cardiac patient.   Exercise & Equipment Safety: - Individual verbal instruction and demonstration of equipment use and safety with use of the equipment.   Cardiac Rehab from 09/13/2017 in [facility] Cardiac and Pulmonary Rehab  Date  09/13/17  Educator  [facility]  Instruction Review Code  1- Verbalizes Understanding      Infection Prevention: - Provides verbal and written material to individual with discussion of infection control including proper hand washing and proper equipment cleaning during exercise session.   Cardiac Rehab from 09/13/2017 in [facility] Cardiac and Pulmonary Rehab  Date  09/13/17  Educator  [facility]  Instruction Review Code  1- Verbalizes Understanding      Falls Prevention: - Provides verbal and written material to individual with discussion of falls prevention and safety.   Cardiac Rehab from 09/13/2017 in [facility] Cardiac and Pulmonary Rehab  Date  09/13/17  Educator  [facility]  Instruction Review Code  1- Verbalizes Understanding      Diabetes: - Individual verbal and written instruction to review signs/symptoms of diabetes, desired ranges of glucose level fasting, after meals and with exercise. Acknowledge that pre and post exercise glucose checks will be done for 3 sessions at entry of program.   Cardiac Rehab from 09/13/2017 in [facility] Cardiac and Pulmonary Rehab  Date  09/13/17  Educator  [facility]  Instruction Review Code  1- Verbalizes Understanding      Know Your Numbers and Risk Factors: -Group verbal and written instruction about important numbers in your health.  Discussion of what are risk factors and how they play a role in the disease  process.  Review of Cholesterol, Blood Pressure, Diabetes, and BMI and the role they play in your overall health.   Sleep Hygiene: -Provides group verbal  and written instruction about how sleep can affect your health.  Define sleep hygiene, discuss sleep cycles and impact of sleep habits. Review good sleep hygiene tips.    Other: -Provides group and verbal instruction on various topics (see comments)   Knowledge Questionnaire Score: Knowledge Questionnaire Score - 09/13/17 1446      Knowledge Questionnaire Score   Pre Score  19/26       Core Components/Risk Factors/Patient Goals at Admission: Personal Goals and Risk Factors at Admission - 09/13/17 1446      Core Components/Risk Factors/Patient Goals on Admission    Weight Management  Yes;Obesity;Weight Loss    Intervention  Weight Management: Develop a combined nutrition and exercise program designed to reach desired caloric intake, while maintaining appropriate intake of nutrient and fiber, sodium and fats, and appropriate energy expenditure required for the weight goal.;Weight Management: Provide education and appropriate resources to help participant work on and attain dietary goals.;Weight Management/Obesity: Establish reasonable short term and long term weight goals.;Obesity: Provide education and appropriate resources to help participant work on and attain dietary goals.    Admit Weight  312 lb (141.5 kg)    Goal Weight: Short Term  308 lb (139.7 kg)    Goal Weight: Long Term  270 lb (122.5 kg)    Expected Outcomes  Short Term: Continue to assess and modify interventions until short term weight is achieved;Long Term: Adherence to nutrition and physical activity/exercise program aimed toward attainment of established weight goal;Weight Loss: Understanding of general recommendations for a balanced deficit meal plan, which promotes 1-2 lb weight loss per week and includes a negative energy balance of [phone] kcal/d;Understanding  recommendations for meals to include 15-35% energy as protein, 25-35% energy from fat, 35-60% energy from carbohydrates, less than '200mg'$  of dietary cholesterol, 20-35 gm of total fiber daily;Understanding of distribution of calorie intake throughout the day with the consumption of 4-5 meals/snacks    Diabetes  Yes    Intervention  Provide education about signs/symptoms and action to take for hypo/hyperglycemia.;Provide education about proper nutrition, including hydration, and aerobic/resistive exercise prescription along with prescribed medications to achieve blood glucose in normal ranges: Fasting glucose 65-99 mg/dL    Expected Outcomes  Short Term: Participant verbalizes understanding of the signs/symptoms and immediate care of hyper/hypoglycemia, proper foot care and importance of medication, aerobic/resistive exercise and nutrition plan for blood glucose control.;Long Term: Attainment of HbA1C < 7%.    Heart Failure  Yes    Intervention  Provide a combined exercise and nutrition program that is supplemented with education, support and counseling about heart failure. Directed toward relieving symptoms such as shortness of breath, decreased exercise tolerance, and extremity edema.    Expected Outcomes  Improve functional capacity of life;Short term: Attendance in program 2-3 days a week with increased exercise capacity. Reported lower sodium intake. Reported increased fruit and vegetable intake. Reports medication compliance.;Short term: Daily weights obtained and reported for increase. Utilizing diuretic protocols set by physician.;Long term: Adoption of self-care skills and reduction of barriers for early signs and symptoms recognition and intervention leading to self-care maintenance.    Hypertension  Yes    Intervention  Provide education on lifestyle modifcations including regular physical activity/exercise, weight management, moderate sodium restriction and increased consumption of fresh fruit,  vegetables, and low fat dairy, alcohol moderation, and smoking cessation.;Monitor prescription use compliance.    Expected Outcomes  Short Term: Continued assessment and intervention until BP is < 140/73m HG in hypertensive participants. < 130/84mHG in  hypertensive participants with diabetes, heart failure or chronic kidney disease.;Long Term: Maintenance of blood pressure at goal levels.    Lipids  Yes    Intervention  Provide education and support for participant on nutrition & aerobic/resistive exercise along with prescribed medications to achieve LDL <25m, HDL >486m    Expected Outcomes  Short Term: Participant states understanding of desired cholesterol values and is compliant with medications prescribed. Participant is following exercise prescription and nutrition guidelines.;Long Term: Cholesterol controlled with medications as prescribed, with individualized exercise RX and with personalized nutrition plan. Value goals: LDL < 7011mHDL > 40 mg.       Core Components/Risk Factors/Patient Goals Review:    Core Components/Risk Factors/Patient Goals at Discharge (Final Review):    ITP Comments: ITP Comments    Row Name 09/13/17 1442 09/13/17 1453         ITP Comments  Med Review completed. Initial ITP created. [facility]  Zacharias completed 2 min of the 6 min walk and needed to rest. While sitting, it was noted his heart rate was sustaining in 130s. SOB was relieved with rest and he thought it was because he usually gets SOB with movement. He rested in chair for 15 minutes and HR still maintained int he 130s. Patient brought upstairs to ED to be evaluated.          Comments: Initial ITP

## 2017-09-19 ENCOUNTER — Encounter: Payer: Self-pay | Admitting: *Deleted

## 2017-09-19 ENCOUNTER — Encounter: Payer: No Typology Code available for payment source | Attending: Internal Medicine

## 2017-09-19 DIAGNOSIS — Z87891 Personal history of nicotine dependence: Secondary | ICD-10-CM | POA: Insufficient documentation

## 2017-09-19 DIAGNOSIS — I5022 Chronic systolic (congestive) heart failure: Secondary | ICD-10-CM | POA: Insufficient documentation

## 2017-09-19 NOTE — Progress Notes (Signed)
Cardiac Individual Treatment Plan  Patient Details  Name: Tadao Emig MRN: [id number] Date of Birth: Dec 20, 1940 Referring Provider:     Cardiac Rehab from 09/13/2017 in [facility] Card[facility]  [facility]      Initial Encounter Date:    Cardiac Rehab from 09/13/2017 in [facility] Cardiac and Pulmonary Rehab  Date  09/13/17      Visit Diagnosis: Heart failure, chronic systolic (HCC)  Patient's Home Medications on Admission:  Current Outpatient Medications:  .  aspirin EC 81 MG tablet, Take by mouth., Disp: , Rfl:  .  atorvastatin (LIPITOR) 80 MG tablet, Take by mouth., Disp: , Rfl:  .  carvedilol (COREG) 25 MG tablet, Take by mouth., Disp: , Rfl:  .  citalopram (CELEXA) 40 MG tablet, Take by mouth., Disp: , Rfl:  .  fluticasone (FLONASE) 50 MCG/ACT nasal spray, 2 sprays by Each Nare route daily., Disp: , Rfl:  .  gabapentin (NEURONTIN) 100 MG capsule, Take by mouth., Disp: , Rfl:  .  insulin glargine (LANTUS) 100 UNIT/ML injection, Inject into the skin., Disp: , Rfl:  .  insulin regular (NOVOLIN R,HUMULIN R) 100 units/mL injection, Inject into the skin., Disp: , Rfl:  .  loratadine (CLARITIN) 10 MG tablet, Take 10 mg by mouth daily., Disp: , Rfl:  .  pantoprazole (PROTONIX) 40 MG tablet, Take by mouth., Disp: , Rfl:  .  torsemide (DEMADEX) 20 MG tablet, Take by mouth., Disp: , Rfl:   Past Medical History: No past medical history on file.  Tobacco Use: Social History   Tobacco Use  Smoking Status Former Smoker  . Last attempt to quit: 1995  . Years since quitting: 24.6    Labs: Recent Review Flowsheet Data    There is no flowsheet data to display.       Exercise Target Goals: Exercise Program Goal: Individual exercise prescription set using results from initial 6 min walk test and THRR while considering  patient's activity barriers and safety.   Exercise Prescription Goal: Initial exercise prescription builds to 30-45 minutes a day of aerobic  activity, 2-3 days per week.  Home exercise guidelines will be given to patient during program as part of exercise prescription that the participant will acknowledge.  Activity Barriers & Risk Stratification: Activity Barriers & Cardiac Risk Stratification - 09/13/17 1455      Activity Barriers & Cardiac Risk Stratification   Activity Barriers  Back Problems;Shortness of Breath    Cardiac Risk Stratification  High       6 Minute Walk: 6 Minute Walk    Row Name 09/13/17 1426         6 Minute Walk   Distance  200 feet     Walk Time  2 minutes     MPH  1.13     RPE  13     Perceived Dyspnea   2     Symptoms  Yes (comment)     Comments  short of breath - stopped at 2:00      Resting HR  88 bpm     Resting BP  110/64     Resting Oxygen Saturation   94 %     Exercise Oxygen Saturation  during 6 min walk  97 %     Max Ex. HR  144 bpm     Max Ex. BP  124/60     2 Minute Post BP  110/56        Oxygen Initial  Assessment:   Oxygen Re-Evaluation:   Oxygen Discharge (Final Oxygen Re-Evaluation):   Initial Exercise Prescription: Initial Exercise Prescription - 09/13/17 1400      Date of Initial Exercise RX and Referring Provider   Date  09/13/17    [facility] Provider  [facility]      Treadmill   MPH  0.8    Grade  0    Minutes  15   rest as needed   METs  1      T5 Nustep   Level  1    SPM  60    Minutes  15      Biostep-RELP   Level  1    SPM  50    Minutes  15    METs  1      Track   Laps  10    Minutes  15   rest as needed   METs  1      Prescription Details   Frequency (times per week)  3    Duration  Progress to 45 minutes of aerobic exercise without signs/symptoms of physical distress      Intensity   THRR 40-80% of Max Heartrate  110-132    Ratings of Perceived Exertion  11-13    Perceived Dyspnea  0-4      Resistance Training   Training Prescription  Yes    Weight  3 lb    Reps  10-15       Perform Capillary Blood Glucose checks as  needed.  Exercise Prescription Changes: Exercise Prescription Changes    Row Name 09/13/17 1400             Response to Exercise   Blood Pressure (Admit)  110/64       Blood Pressure (Exercise)  124/60       Blood Pressure (Exit)  110/56       Heart Rate (Admit)  81 bpm       Heart Rate (Exercise)  144 bpm       Heart Rate (Exit)  133 bpm       Oxygen Saturation (Admit)  96 %       Oxygen Saturation (Exercise)  98 %       Oxygen Saturation (Exit)  97 %       Rating of Perceived Exertion (Exercise)  13       Perceived Dyspnea (Exercise)  2          Exercise Comments:   Exercise Goals and Review: Exercise Goals    Row Name 09/13/17 1425             Exercise Goals   Increase Physical Activity  Yes       Intervention  Provide advice, education, support and counseling about physical activity/exercise needs.;Develop an individualized exercise prescription for aerobic and resistive training based on initial evaluation findings, risk stratification, comorbidities and participant's personal goals.       Expected Outcomes  Short Term: Attend rehab on a regular basis to increase amount of physical activity.;Long Term: Add in home exercise to make exercise part of routine and to increase amount of physical activity.;Long Term: Exercising regularly at least 3-5 days a week.       Increase Strength and Stamina  Yes       Intervention  Provide advice, education, support and counseling about physical activity/exercise needs.;Develop an individualized exercise prescription for aerobic and resistive training based on initial evaluation findings, risk stratification,  comorbidities and participant's personal goals.       Expected Outcomes  Short Term: Increase workloads from initial exercise prescription for resistance, speed, and METs.;Short Term: Perform resistance training exercises routinely during rehab and add in resistance training at home;Long Term: Improve cardiorespiratory fitness,  muscular endurance and strength as measured by increased METs and functional capacity (6MWT)       Able to understand and use rate of perceived exertion (RPE) scale  Yes       Intervention  Provide education and explanation on how to use RPE scale       Expected Outcomes  Short Term: Able to use RPE daily in rehab to express subjective intensity level;Long Term:  Able to use RPE to guide intensity level when exercising independently       Able to understand and use Dyspnea scale  Yes       Intervention  Provide education and explanation on how to use Dyspnea scale       Expected Outcomes  Short Term: Able to use Dyspnea scale daily in rehab to express subjective sense of shortness of breath during exertion;Long Term: Able to use Dyspnea scale to guide intensity level when exercising independently       Knowledge and understanding of Target Heart Rate Range (THRR)  Yes       Intervention  Provide education and explanation of THRR including how the numbers were predicted and where they are located for reference       Expected Outcomes  Short Term: Able to state/look up THRR;Long Term: Able to use THRR to govern intensity when exercising independently;Short Term: Able to use daily as guideline for intensity in rehab       Able to check pulse independently  Yes       Intervention  Provide education and demonstration on how to check pulse in carotid and radial arteries.;Review the importance of being able to check your own pulse for safety during independent exercise       Expected Outcomes  Short Term: Able to explain why pulse checking is important during independent exercise;Long Term: Able to check pulse independently and accurately       Understanding of Exercise Prescription  Yes       Intervention  Provide education, explanation, and written materials on patient's individual exercise prescription       Expected Outcomes  Short Term: Able to explain program exercise prescription;Long Term: Able to  explain home exercise prescription to exercise independently          Exercise Goals Re-Evaluation :   Discharge Exercise Prescription (Final Exercise Prescription Changes): Exercise Prescription Changes - 09/13/17 1400      Response to Exercise   Blood Pressure (Admit)  110/64    Blood Pressure (Exercise)  124/60    Blood Pressure (Exit)  110/56    Heart Rate (Admit)  81 bpm    Heart Rate (Exercise)  144 bpm    Heart Rate (Exit)  133 bpm    Oxygen Saturation (Admit)  96 %    Oxygen Saturation (Exercise)  98 %    Oxygen Saturation (Exit)  97 %    Rating of Perceived Exertion (Exercise)  13    Perceived Dyspnea (Exercise)  2       Nutrition:  Target Goals: Understanding of nutrition guidelines, daily intake of sodium <1564m, cholesterol <207m calories 30% from fat and 7% or less from saturated fats, daily to have 5 or more servings of  fruits and vegetables.  Biometrics: Pre Biometrics - 09/13/17 1424      Pre Biometrics   Height  _0  (1.778 m)    Weight  (!) 312 lb 4.8 oz (141.7 kg)    Waist Circumference  55 inches    Hip Circumference  54 inches    Waist to Hip Ratio  1.02 %    BMI (Calculated)  44.81        Nutrition Therapy Plan and Nutrition Goals: Nutrition Therapy & Goals - 09/13/17 1445      Intervention Plan   Intervention  Prescribe, educate and counsel regarding individualized specific dietary modifications aiming towards targeted core components such as weight, hypertension, lipid management, diabetes, heart failure and other comorbidities.;Nutrition handout(s) given to patient.    Expected Outcomes  Short Term Goal: Understand basic principles of dietary content, such as calories, fat, sodium, cholesterol and nutrients.;Long Term Goal: Adherence to prescribed nutrition plan.;Short Term Goal: A plan has been developed with personal nutrition goals set during dietitian appointment.       Nutrition Assessments: Nutrition Assessments - 09/13/17 1445       MEDFICTS Scores   Pre Score  58       Nutrition Goals Re-Evaluation:   Nutrition Goals Discharge (Final Nutrition Goals Re-Evaluation):   Psychosocial: Target Goals: Acknowledge presence or absence of significant depression and/or stress, maximize coping skills, provide positive support system. Participant is able to verbalize types and ability to use techniques and skills needed for reducing stress and depression.   Initial Review & Psychosocial Screening: Initial Psych Review & Screening - 09/13/17 1443      Initial Review   Current issues with  Current Stress Concerns    Source of Stress Concerns  Chronic Illness;Unable to perform yard/household activities;Unable to participate in former interests or hobbies      [place]?  Yes   wife     Barriers   Psychosocial barriers to participate in program  There are no identifiable barriers or psychosocial needs.;The patient should benefit from training in stress management and relaxation.      Screening Interventions   Interventions  Encouraged to exercise;Program counselor consult;To provide support and resources with identified psychosocial needs;Provide feedback about the scores to participant    Expected Outcomes  Short Term goal: Utilizing psychosocial counselor, staff and physician to assist with identification of specific Stressors or current issues interfering with healing process. Setting desired goal for each stressor or current issue identified.;Long Term Goal: Stressors or current issues are controlled or eliminated.;Short Term goal: Identification and review with participant of any Quality of Life or Depression concerns found by scoring the questionnaire.;Long Term goal: The participant improves quality of Life and PHQ9 Scores as seen by post scores and/or verbalization of changes       Quality of Life Scores:  Quality of Life - 09/13/17 1444      Quality of Life   Select  Quality of  Life      Quality of Life Scores   Health/Function Pre  12.29 %    Socioeconomic Pre  19.83 %    Psych/Spiritual Pre  25.57 %    Family Pre  19.3 %    GLOBAL Pre  17.7 %      Scores of 19 and below usually indicate a poorer quality of life in these areas.  A difference of  2-3 points is a clinically meaningful difference.  A difference of 2-3 points  in the total score of the Quality of Life Index has been associated with significant improvement in overall quality of life, self-image, physical symptoms, and general health in studies assessing change in quality of life.  PHQ-9: Recent Review Flowsheet Data    Depression screen [facility] 2/9 09/13/2017   Decreased Interest 2   Down, Depressed, Hopeless 2   PHQ - 2 Score 4   Altered sleeping 2   Tired, decreased energy 3   Change in appetite 0   Feeling bad or failure about yourself  0   Trouble concentrating 2   Moving slowly or fidgety/restless 0   Suicidal thoughts 0   PHQ-9 Score 11   Difficult doing work/chores Somewhat difficult     Interpretation of Total Score  Total Score Depression Severity:  1-4 = Minimal depression, 5-9 = Mild depression, 10-14 = Moderate depression, 15-19 = Moderately severe depression, 20-27 = Severe depression   Psychosocial Evaluation and Intervention:   Psychosocial Re-Evaluation:   Psychosocial Discharge (Final Psychosocial Re-Evaluation):   Vocational Rehabilitation: Provide vocational rehab assistance to qualifying candidates.   Vocational Rehab Evaluation & Intervention: Vocational Rehab - 09/13/17 1446      Initial Vocational Rehab Evaluation & Intervention   Assessment shows need for Vocational Rehabilitation  No       Education: Education Goals: Education classes will be provided on a variety of topics geared toward better understanding of heart health and risk factor modification. Participant will state understanding/return demonstration of topics presented as noted by education test  scores.  Learning Barriers/Preferences: Learning Barriers/Preferences - 09/13/17 1445      Learning Barriers/Preferences   Learning Barriers  Hearing    Learning Preferences  Computer/Internet;Individual Instruction       Education Topics:  AED/CPR: - Group verbal and written instruction with the use of models to demonstrate the basic use of the AED with the basic ABC's of resuscitation.   General Nutrition Guidelines/Fats and Fiber: -Group instruction provided by verbal, written material, models and posters to present the general guidelines for heart healthy nutrition. Gives an explanation and review of dietary fats and fiber.   Controlling Sodium/Reading Food Labels: -Group verbal and written material supporting the discussion of sodium use in heart healthy nutrition. Review and explanation with models, verbal and written materials for utilization of the food label.   Exercise Physiology & General Exercise Guidelines: - Group verbal and written instruction with models to review the exercise physiology of the cardiovascular system and associated critical values. Provides general exercise guidelines with specific guidelines to those with heart or lung disease.    Aerobic Exercise & Resistance Training: - Gives group verbal and written instruction on the various components of exercise. Focuses on aerobic and resistive training programs and the benefits of this training and how to safely progress through these programs..   Flexibility, Balance, Mind/Body Relaxation: Provides group verbal/written instruction on the benefits of flexibility and balance training, including mind/body exercise modes such as yoga, pilates and tai chi.  Demonstration and skill practice provided.   Stress and Anxiety: - Provides group verbal and written instruction about the health risks of elevated stress and causes of high stress.  Discuss the correlation between heart/lung disease and anxiety and  treatment options. Review healthy ways to manage with stress and anxiety.   Depression: - Provides group verbal and written instruction on the correlation between heart/lung disease and depressed mood, treatment options, and the stigmas associated with seeking treatment.   Anatomy & Physiology of  the Heart: - Group verbal and written instruction and models provide basic cardiac anatomy and physiology, with the coronary electrical and arterial systems. Review of Valvular disease and Heart Failure   Cardiac Procedures: - Group verbal and written instruction to review commonly prescribed medications for heart disease. Reviews the medication, class of the drug, and side effects. Includes the steps to properly store meds and maintain the prescription regimen. (beta blockers and nitrates)   Cardiac Medications I: - Group verbal and written instruction to review commonly prescribed medications for heart disease. Reviews the medication, class of the drug, and side effects. Includes the steps to properly store meds and maintain the prescription regimen.   Cardiac Medications II: -Group verbal and written instruction to review commonly prescribed medications for heart disease. Reviews the medication, class of the drug, and side effects. (all other drug classes)    Go Sex-Intimacy & Heart Disease, Get SMART - Goal Setting: - Group verbal and written instruction through game format to discuss heart disease and the return to sexual intimacy. Provides group verbal and written material to discuss and apply goal setting through the application of the S.M.A.R.T. Method.   Other Matters of the Heart: - Provides group verbal, written materials and models to describe Stable Angina and Peripheral Artery. Includes description of the disease process and treatment options available to the cardiac patient.   Exercise & Equipment Safety: - Individual verbal instruction and demonstration of equipment use and  safety with use of the equipment.   Cardiac Rehab from 09/13/2017 in [facility] Cardiac and Pulmonary Rehab  Date  09/13/17  Educator  [facility]  Instruction Review Code  1- Verbalizes Understanding      Infection Prevention: - Provides verbal and written material to individual with discussion of infection control including proper hand washing and proper equipment cleaning during exercise session.   Cardiac Rehab from 09/13/2017 in [facility] Cardiac and Pulmonary Rehab  Date  09/13/17  Educator  [facility]  Instruction Review Code  1- Verbalizes Understanding      Falls Prevention: - Provides verbal and written material to individual with discussion of falls prevention and safety.   Cardiac Rehab from 09/13/2017 in [facility] Cardiac and Pulmonary Rehab  Date  09/13/17  Educator  [facility]  Instruction Review Code  1- Verbalizes Understanding      Diabetes: - Individual verbal and written instruction to review signs/symptoms of diabetes, desired ranges of glucose level fasting, after meals and with exercise. Acknowledge that pre and post exercise glucose checks will be done for 3 sessions at entry of program.   Cardiac Rehab from 09/13/2017 in [facility] Cardiac and Pulmonary Rehab  Date  09/13/17  Educator  [facility]  Instruction Review Code  1- Verbalizes Understanding      Know Your Numbers and Risk Factors: -Group verbal and written instruction about important numbers in your health.  Discussion of what are risk factors and how they play a role in the disease process.  Review of Cholesterol, Blood Pressure, Diabetes, and BMI and the role they play in your overall health.   Sleep Hygiene: -Provides group verbal and written instruction about how sleep can affect your health.  Define sleep hygiene, discuss sleep cycles and impact of sleep habits. Review good sleep hygiene tips.    Other: -Provides group and verbal instruction on various topics (see comments)   Knowledge Questionnaire Score: Knowledge Questionnaire Score -  09/13/17 1446      Knowledge Questionnaire Score   Pre Score  19/26  correct answers reviewed with [facility], focus on Exercise and Nutrition      Core Components/Risk Factors/Patient Goals at Admission: Personal Goals and Risk Factors at Admission - 09/13/17 1446      Core Components/Risk Factors/Patient Goals on Admission    Weight Management  Yes;Obesity;Weight Loss    Intervention  Weight Management: Develop a combined nutrition and exercise program designed to reach desired caloric intake, while maintaining appropriate intake of nutrient and fiber, sodium and fats, and appropriate energy expenditure required for the weight goal.;Weight Management: Provide education and appropriate resources to help participant work on and attain dietary goals.;Weight Management/Obesity: Establish reasonable short term and long term weight goals.;Obesity: Provide education and appropriate resources to help participant work on and attain dietary goals.    Admit Weight  312 lb (141.5 kg)    Goal Weight: Short Term  308 lb (139.7 kg)    Goal Weight: Long Term  270 lb (122.5 kg)    Expected Outcomes  Short Term: Continue to assess and modify interventions until short term weight is achieved;Long Term: Adherence to nutrition and physical activity/exercise program aimed toward attainment of established weight goal;Weight Loss: Understanding of general recommendations for a balanced deficit meal plan, which promotes 1-2 lb weight loss per week and includes a negative energy balance of [phone] kcal/d;Understanding recommendations for meals to include 15-35% energy as protein, 25-35% energy from fat, 35-60% energy from carbohydrates, less than '200mg'$  of dietary cholesterol, 20-35 gm of total fiber daily;Understanding of distribution of calorie intake throughout the day with the consumption of 4-5 meals/snacks    Diabetes  Yes    Intervention  Provide education about signs/symptoms and action to take for  hypo/hyperglycemia.;Provide education about proper nutrition, including hydration, and aerobic/resistive exercise prescription along with prescribed medications to achieve blood glucose in normal ranges: Fasting glucose 65-99 mg/dL    Expected Outcomes  Short Term: Participant verbalizes understanding of the signs/symptoms and immediate care of hyper/hypoglycemia, proper foot care and importance of medication, aerobic/resistive exercise and nutrition plan for blood glucose control.;Long Term: Attainment of HbA1C < 7%.    Heart Failure  Yes    Intervention  Provide a combined exercise and nutrition program that is supplemented with education, support and counseling about heart failure. Directed toward relieving symptoms such as shortness of breath, decreased exercise tolerance, and extremity edema.    Expected Outcomes  Improve functional capacity of life;Short term: Attendance in program 2-3 days a week with increased exercise capacity. Reported lower sodium intake. Reported increased fruit and vegetable intake. Reports medication compliance.;Short term: Daily weights obtained and reported for increase. Utilizing diuretic protocols set by physician.;Long term: Adoption of self-care skills and reduction of barriers for early signs and symptoms recognition and intervention leading to self-care maintenance.    Hypertension  Yes    Intervention  Provide education on lifestyle modifcations including regular physical activity/exercise, weight management, moderate sodium restriction and increased consumption of fresh fruit, vegetables, and low fat dairy, alcohol moderation, and smoking cessation.;Monitor prescription use compliance.    Expected Outcomes  Short Term: Continued assessment and intervention until BP is < 140/34m HG in hypertensive participants. < 130/845mHG in hypertensive participants with diabetes, heart failure or chronic kidney disease.;Long Term: Maintenance of blood pressure at goal levels.     Lipids  Yes    Intervention  Provide education and support for participant on nutrition & aerobic/resistive exercise along with prescribed medications to achieve LDL '70mg'$ , HDL >'40mg'$ .    Expected Outcomes  Short  Term: Participant states understanding of desired cholesterol values and is compliant with medications prescribed. Participant is following exercise prescription and nutrition guidelines.;Long Term: Cholesterol controlled with medications as prescribed, with individualized exercise RX and with personalized nutrition plan. Value goals: LDL < 75m, HDL > 40 mg.       Core Components/Risk Factors/Patient Goals Review:    Core Components/Risk Factors/Patient Goals at Discharge (Final Review):    ITP Comments: ITP Comments    Row Name 09/13/17 1442 09/13/17 1453 09/19/17 0910       ITP Comments  Med Review completed. Initial ITP created. [place]  Elisha completed 2 min of the 6 min walk and needed to rest. While sitting, it was noted his heart rate was sustaining in 130s. SOB was relieved with rest and he thought it was because he usually gets SOB with movement. He rested in chair for 15 minutes and HR still maintained int he 130s. Patient brought upstairs to ED to be evaluated.   30 day review completed. ITP sent to Dr. BRamonita Lab covering for Dr. MEmily Filbert Medical Director of Cardiac Rehab. Continue with ITP unless changes are made by physician.  new to program. Has not started exercise yet.         Comments: 30 day reveiw

## 2017-09-20 ENCOUNTER — Encounter: Payer: Self-pay | Admitting: *Deleted

## 2017-09-20 NOTE — Progress Notes (Signed)
Yesterday Trevor Jennings showed up in a wheelchair to exercise.I told him and his wife, I was sorry but he needs clearance to exercise after his 09/13/2017 Trevor Jennings Dept visit. I contacted [facility] Cardiology staff last evening and faxed Trevor Jennings note for 09/13/2017 which Trevor Jennings had a sustained heart rate of 150 after he only completed 1.5 minutes of a 6 minute walk in the hallway. His wife said this was his normal that he couldn't walk far without having to sit down since he was short of breath. Emerg Dept did an EKG- interpreted as Wide complex tachycardia and was given potassium for hypokalemia. He had not taken his medicines on 09/13/2017. Emerg Dept note read that they wanted to keep Trevor Jennings and start an amiodarone drip but he preferred to go home and signed out AMA. He has not followed up with any MD's and we need him to followed up with his MD and to obtain a new exercise clearance. I faxed to [phone].   Today I called Trevor Jennings's primary [facility] in [facility] [phone] and told them the same situation that Trevor Jennings needs to be seen after his [facility] 09/13/2017 Emerg Dept visit. I faxed the [place] notes to their fax at [phone].

## 2017-09-20 NOTE — Progress Notes (Signed)
I contacted [facility] Cardiology staff last evening and faxed [place] note for 09/13/2017 which Trevor Jennings had a sustained heart rate of 150 after he only completed 1.5 minutes of a 6 minute walk in the hallway. His wife said this was his normal that he couldn't walk far without having to sit down since he was short of breath. Emerg Dept did an EKG- interpreted as Wide complex tachycardia and was given potassium for hypokalemia. He had not taken his medicines on 09/13/2017. Emerg Dept note read that they wanted to keep Trevor Jennings and start an amiodarone drip but he preferred to go home and signed out AMA. He has not followed up with any MD's and we need him to followed up with his MD and to obtain a new exercise clearance. I faxed to [phone].   Today I called Trevor Jennings's primary [facility] in [facility] [phone] and told them the same situation that Trevor Jennings needs to be seen after his [facility] 09/13/2017 Emerg Dept visit. I faxed the [place] notes to their fax at [phone].

## 2017-10-09 ENCOUNTER — Telehealth: Payer: Self-pay | Admitting: *Deleted

## 2017-10-09 ENCOUNTER — Encounter: Payer: Self-pay | Admitting: *Deleted

## 2017-10-09 DIAGNOSIS — I5022 Chronic systolic (congestive) heart failure: Secondary | ICD-10-CM

## 2017-10-09 NOTE — Telephone Encounter (Signed)
Dr. Doren Custard called to let us know that he has a follow up appointment with Trevor Jennings next week.  Trevor Jennings was just discharged last week from a gout flare up and had some SVT while admitted then.  Dr. Doren Custard is considering a stress test prior to releasing him to start rehab.  He will let us know when Trevor Jennings can start after his appointment next week.

## 2017-10-10 ENCOUNTER — Encounter: Payer: No Typology Code available for payment source | Attending: Internal Medicine

## 2017-10-10 DIAGNOSIS — I5022 Chronic systolic (congestive) heart failure: Secondary | ICD-10-CM | POA: Insufficient documentation

## 2017-10-10 DIAGNOSIS — Z87891 Personal history of nicotine dependence: Secondary | ICD-10-CM | POA: Insufficient documentation

## 2017-10-17 ENCOUNTER — Encounter: Payer: Self-pay | Admitting: *Deleted

## 2017-10-17 DIAGNOSIS — I5022 Chronic systolic (congestive) heart failure: Secondary | ICD-10-CM

## 2017-10-17 NOTE — Progress Notes (Signed)
Cardiac Individual Treatment Plan  Patient Details  Name: Trevor Jennings MRN: [id number] Date of Birth: [birth year] Referring Provider:     Cardiac Rehab from 09/13/2017 in [facility]  [facility]      Initial Encounter Date:    Cardiac Rehab from 09/13/2017 in [facility] Cardiac and Pulmonary Rehab  Date  09/13/17      Visit Diagnosis: Heart failure, chronic systolic (HCC)  Patient's Home Medications on Admission:  Current Outpatient Medications:  .  aspirin EC 81 MG tablet, Take by mouth., Disp: , Rfl:  .  atorvastatin (LIPITOR) 80 MG tablet, Take by mouth., Disp: , Rfl:  .  carvedilol (COREG) 25 MG tablet, Take by mouth., Disp: , Rfl:  .  citalopram (CELEXA) 40 MG tablet, Take by mouth., Disp: , Rfl:  .  fluticasone (FLONASE) 50 MCG/ACT nasal spray, 2 sprays by Each Nare route daily., Disp: , Rfl:  .  gabapentin (NEURONTIN) 100 MG capsule, Take by mouth., Disp: , Rfl:  .  insulin glargine (LANTUS) 100 UNIT/ML injection, Inject into the skin., Disp: , Rfl:  .  insulin regular (NOVOLIN R,HUMULIN R) 100 units/mL injection, Inject into the skin., Disp: , Rfl:  .  loratadine (CLARITIN) 10 MG tablet, Take 10 mg by mouth daily., Disp: , Rfl:  .  pantoprazole (PROTONIX) 40 MG tablet, Take by mouth., Disp: , Rfl:  .  torsemide (DEMADEX) 20 MG tablet, Take by mouth., Disp: , Rfl:   Past Medical History: No past medical history on file.  Tobacco Use: Social History   Tobacco Use  Smoking Status Former Smoker  . Last attempt to quit: 1995  . Years since quitting: 24.7    Labs: Recent Review Flowsheet Data    There is no flowsheet data to display.       Exercise Target Goals: Exercise Program Goal: Individual exercise prescription set using results from initial 6 min walk test and THRR while considering  patient's activity barriers and safety.   Exercise Prescription Goal: Initial exercise prescription builds to 30-45 minutes a day of aerobic  activity, 2-3 days per week.  Home exercise guidelines will be given to patient during program as part of exercise prescription that the participant will acknowledge.  Activity Barriers & Risk Stratification: Activity Barriers & Cardiac Risk Stratification - 09/13/17 1455      Activity Barriers & Cardiac Risk Stratification   Activity Barriers  Back Problems;Shortness of Breath    Cardiac Risk Stratification  High       6 Minute Walk: 6 Minute Walk    Row Name 09/13/17 1426         6 Minute Walk   Distance  200 feet     Walk Time  2 minutes     MPH  1.13     RPE  13     Perceived Dyspnea   2     Symptoms  Yes (comment)     Comments  short of breath - stopped at 2:00      Resting HR  88 bpm     Resting BP  110/64     Resting Oxygen Saturation   94 %     Exercise Oxygen Saturation  during 6 min walk  97 %     Max Ex. HR  144 bpm     Max Ex. BP  124/60     2 Minute Post BP  110/56        Oxygen Initial  Assessment:   Oxygen Re-Evaluation:   Oxygen Discharge (Final Oxygen Re-Evaluation):   Initial Exercise Prescription: Initial Exercise Prescription - 09/13/17 1400      Date of Initial Exercise RX and Referring Provider   Date  09/13/17    [facility] Provider  [facility]      Treadmill   MPH  0.8    Grade  0    Minutes  15   rest as needed   METs  1      T5 Nustep   Level  1    SPM  60    Minutes  15      Biostep-RELP   Level  1    SPM  50    Minutes  15    METs  1      Track   Laps  10    Minutes  15   rest as needed   METs  1      Prescription Details   Frequency (times per week)  3    Duration  Progress to 45 minutes of aerobic exercise without signs/symptoms of physical distress      Intensity   THRR 40-80% of Max Heartrate  110-132    Ratings of Perceived Exertion  11-13    Perceived Dyspnea  0-4      Resistance Training   Training Prescription  Yes    Weight  3 lb    Reps  10-15       Perform Capillary Blood Glucose checks as  needed.  Exercise Prescription Changes: Exercise Prescription Changes    Row Name 09/13/17 1400             Response to Exercise   Blood Pressure (Admit)  110/64       Blood Pressure (Exercise)  124/60       Blood Pressure (Exit)  110/56       Heart Rate (Admit)  81 bpm       Heart Rate (Exercise)  144 bpm       Heart Rate (Exit)  133 bpm       Oxygen Saturation (Admit)  96 %       Oxygen Saturation (Exercise)  98 %       Oxygen Saturation (Exit)  97 %       Rating of Perceived Exertion (Exercise)  13       Perceived Dyspnea (Exercise)  2          Exercise Comments:   Exercise Goals and Review: Exercise Goals    Row Name 09/13/17 1425             Exercise Goals   Increase Physical Activity  Yes       Intervention  Provide advice, education, support and counseling about physical activity/exercise needs.;Develop an individualized exercise prescription for aerobic and resistive training based on initial evaluation findings, risk stratification, comorbidities and participant's personal goals.       Expected Outcomes  Short Term: Attend rehab on a regular basis to increase amount of physical activity.;Long Term: Add in home exercise to make exercise part of routine and to increase amount of physical activity.;Long Term: Exercising regularly at least 3-5 days a week.       Increase Strength and Stamina  Yes       Intervention  Provide advice, education, support and counseling about physical activity/exercise needs.;Develop an individualized exercise prescription for aerobic and resistive training based on initial evaluation findings, risk stratification,  comorbidities and participant's personal goals.       Expected Outcomes  Short Term: Increase workloads from initial exercise prescription for resistance, speed, and METs.;Short Term: Perform resistance training exercises routinely during rehab and add in resistance training at home;Long Term: Improve cardiorespiratory fitness,  muscular endurance and strength as measured by increased METs and functional capacity (6MWT)       Able to understand and use rate of perceived exertion (RPE) scale  Yes       Intervention  Provide education and explanation on how to use RPE scale       Expected Outcomes  Short Term: Able to use RPE daily in rehab to express subjective intensity level;Long Term:  Able to use RPE to guide intensity level when exercising independently       Able to understand and use Dyspnea scale  Yes       Intervention  Provide education and explanation on how to use Dyspnea scale       Expected Outcomes  Short Term: Able to use Dyspnea scale daily in rehab to express subjective sense of shortness of breath during exertion;Long Term: Able to use Dyspnea scale to guide intensity level when exercising independently       Knowledge and understanding of Target Heart Rate Range (THRR)  Yes       Intervention  Provide education and explanation of THRR including how the numbers were predicted and where they are located for reference       Expected Outcomes  Short Term: Able to state/look up THRR;Long Term: Able to use THRR to govern intensity when exercising independently;Short Term: Able to use daily as guideline for intensity in rehab       Able to check pulse independently  Yes       Intervention  Provide education and demonstration on how to check pulse in carotid and radial arteries.;Review the importance of being able to check your own pulse for safety during independent exercise       Expected Outcomes  Short Term: Able to explain why pulse checking is important during independent exercise;Long Term: Able to check pulse independently and accurately       Understanding of Exercise Prescription  Yes       Intervention  Provide education, explanation, and written materials on patient's individual exercise prescription       Expected Outcomes  Short Term: Able to explain program exercise prescription;Long Term: Able to  explain home exercise prescription to exercise independently          Exercise Goals Re-Evaluation :   Discharge Exercise Prescription (Final Exercise Prescription Changes): Exercise Prescription Changes - 09/13/17 1400      Response to Exercise   Blood Pressure (Admit)  110/64    Blood Pressure (Exercise)  124/60    Blood Pressure (Exit)  110/56    Heart Rate (Admit)  81 bpm    Heart Rate (Exercise)  144 bpm    Heart Rate (Exit)  133 bpm    Oxygen Saturation (Admit)  96 %    Oxygen Saturation (Exercise)  98 %    Oxygen Saturation (Exit)  97 %    Rating of Perceived Exertion (Exercise)  13    Perceived Dyspnea (Exercise)  2       Nutrition:  Target Goals: Understanding of nutrition guidelines, daily intake of sodium <1564m, cholesterol <207m calories 30% from fat and 7% or less from saturated fats, daily to have 5 or more servings of  fruits and vegetables.  Biometrics: Pre Biometrics - 09/13/17 1424      Pre Biometrics   Height  _0  (1.778 m)    Weight  (!) 312 lb 4.8 oz (141.7 kg)    Waist Circumference  55 inches    Hip Circumference  54 inches    Waist to Hip Ratio  1.02 %    BMI (Calculated)  44.81        Nutrition Therapy Plan and Nutrition Goals: Nutrition Therapy & Goals - 09/13/17 1445      Intervention Plan   Intervention  Prescribe, educate and counsel regarding individualized specific dietary modifications aiming towards targeted core components such as weight, hypertension, lipid management, diabetes, heart failure and other comorbidities.;Nutrition handout(s) given to patient.    Expected Outcomes  Short Term Goal: Understand basic principles of dietary content, such as calories, fat, sodium, cholesterol and nutrients.;Long Term Goal: Adherence to prescribed nutrition plan.;Short Term Goal: A plan has been developed with personal nutrition goals set during dietitian appointment.       Nutrition Assessments: Nutrition Assessments - 09/13/17 1445       MEDFICTS Scores   Pre Score  58       Nutrition Goals Re-Evaluation:   Nutrition Goals Discharge (Final Nutrition Goals Re-Evaluation):   Psychosocial: Target Goals: Acknowledge presence or absence of significant depression and/or stress, maximize coping skills, provide positive support system. Participant is able to verbalize types and ability to use techniques and skills needed for reducing stress and depression.   Initial Review & Psychosocial Screening: Initial Psych Review & Screening - 09/13/17 1443      Initial Review   Current issues with  Current Stress Concerns    Source of Stress Concerns  Chronic Illness;Unable to perform yard/household activities;Unable to participate in former interests or hobbies      [place]?  Yes   wife     Barriers   Psychosocial barriers to participate in program  There are no identifiable barriers or psychosocial needs.;The patient should benefit from training in stress management and relaxation.      Screening Interventions   Interventions  Encouraged to exercise;Program counselor consult;To provide support and resources with identified psychosocial needs;Provide feedback about the scores to participant    Expected Outcomes  Short Term goal: Utilizing psychosocial counselor, staff and physician to assist with identification of specific Stressors or current issues interfering with healing process. Setting desired goal for each stressor or current issue identified.;Long Term Goal: Stressors or current issues are controlled or eliminated.;Short Term goal: Identification and review with participant of any Quality of Life or Depression concerns found by scoring the questionnaire.;Long Term goal: The participant improves quality of Life and PHQ9 Scores as seen by post scores and/or verbalization of changes       Quality of Life Scores:  Quality of Life - 09/13/17 1444      Quality of Life   Select  Quality of  Life      Quality of Life Scores   Health/Function Pre  12.29 %    Socioeconomic Pre  19.83 %    Psych/Spiritual Pre  25.57 %    Family Pre  19.3 %    GLOBAL Pre  17.7 %      Scores of 19 and below usually indicate a poorer quality of life in these areas.  A difference of  2-3 points is a clinically meaningful difference.  A difference of 2-3 points  in the total score of the Quality of Life Index has been associated with significant improvement in overall quality of life, self-image, physical symptoms, and general health in studies assessing change in quality of life.  PHQ-9: Recent Review Flowsheet Data    Depression screen [facility] 2/9 09/13/2017   Decreased Interest 2   Down, Depressed, Hopeless 2   PHQ - 2 Score 4   Altered sleeping 2   Tired, decreased energy 3   Change in appetite 0   Feeling bad or failure about yourself  0   Trouble concentrating 2   Moving slowly or fidgety/restless 0   Suicidal thoughts 0   PHQ-9 Score 11   Difficult doing work/chores Somewhat difficult     Interpretation of Total Score  Total Score Depression Severity:  1-4 = Minimal depression, 5-9 = Mild depression, 10-14 = Moderate depression, 15-19 = Moderately severe depression, 20-27 = Severe depression   Psychosocial Evaluation and Intervention:   Psychosocial Re-Evaluation:   Psychosocial Discharge (Final Psychosocial Re-Evaluation):   Vocational Rehabilitation: Provide vocational rehab assistance to qualifying candidates.   Vocational Rehab Evaluation & Intervention: Vocational Rehab - 09/13/17 1446      Initial Vocational Rehab Evaluation & Intervention   Assessment shows need for Vocational Rehabilitation  No       Education: Education Goals: Education classes will be provided on a variety of topics geared toward better understanding of heart health and risk factor modification. Participant will state understanding/return demonstration of topics presented as noted by education test  scores.  Learning Barriers/Preferences: Learning Barriers/Preferences - 09/13/17 1445      Learning Barriers/Preferences   Learning Barriers  Hearing    Learning Preferences  Computer/Internet;Individual Instruction       Education Topics:  AED/CPR: - Group verbal and written instruction with the use of models to demonstrate the basic use of the AED with the basic ABC's of resuscitation.   General Nutrition Guidelines/Fats and Fiber: -Group instruction provided by verbal, written material, models and posters to present the general guidelines for heart healthy nutrition. Gives an explanation and review of dietary fats and fiber.   Controlling Sodium/Reading Food Labels: -Group verbal and written material supporting the discussion of sodium use in heart healthy nutrition. Review and explanation with models, verbal and written materials for utilization of the food label.   Exercise Physiology & General Exercise Guidelines: - Group verbal and written instruction with models to review the exercise physiology of the cardiovascular system and associated critical values. Provides general exercise guidelines with specific guidelines to those with heart or lung disease.    Aerobic Exercise & Resistance Training: - Gives group verbal and written instruction on the various components of exercise. Focuses on aerobic and resistive training programs and the benefits of this training and how to safely progress through these programs..   Flexibility, Balance, Mind/Body Relaxation: Provides group verbal/written instruction on the benefits of flexibility and balance training, including mind/body exercise modes such as yoga, pilates and tai chi.  Demonstration and skill practice provided.   Stress and Anxiety: - Provides group verbal and written instruction about the health risks of elevated stress and causes of high stress.  Discuss the correlation between heart/lung disease and anxiety and  treatment options. Review healthy ways to manage with stress and anxiety.   Depression: - Provides group verbal and written instruction on the correlation between heart/lung disease and depressed mood, treatment options, and the stigmas associated with seeking treatment.   Anatomy & Physiology of  the Heart: - Group verbal and written instruction and models provide basic cardiac anatomy and physiology, with the coronary electrical and arterial systems. Review of Valvular disease and Heart Failure   Cardiac Procedures: - Group verbal and written instruction to review commonly prescribed medications for heart disease. Reviews the medication, class of the drug, and side effects. Includes the steps to properly store meds and maintain the prescription regimen. (beta blockers and nitrates)   Cardiac Medications I: - Group verbal and written instruction to review commonly prescribed medications for heart disease. Reviews the medication, class of the drug, and side effects. Includes the steps to properly store meds and maintain the prescription regimen.   Cardiac Medications II: -Group verbal and written instruction to review commonly prescribed medications for heart disease. Reviews the medication, class of the drug, and side effects. (all other drug classes)    Go Sex-Intimacy & Heart Disease, Get SMART - Goal Setting: - Group verbal and written instruction through game format to discuss heart disease and the return to sexual intimacy. Provides group verbal and written material to discuss and apply goal setting through the application of the S.M.A.R.T. Method.   Other Matters of the Heart: - Provides group verbal, written materials and models to describe Stable Angina and Peripheral Artery. Includes description of the disease process and treatment options available to the cardiac patient.   Exercise & Equipment Safety: - Individual verbal instruction and demonstration of equipment use and  safety with use of the equipment.   Cardiac Rehab from 09/13/2017 in [facility] Cardiac and Pulmonary Rehab  Date  09/13/17  Educator  [facility]  Instruction Review Code  1- Verbalizes Understanding      Infection Prevention: - Provides verbal and written material to individual with discussion of infection control including proper hand washing and proper equipment cleaning during exercise session.   Cardiac Rehab from 09/13/2017 in [facility] Cardiac and Pulmonary Rehab  Date  09/13/17  Educator  [facility]  Instruction Review Code  1- Verbalizes Understanding      Falls Prevention: - Provides verbal and written material to individual with discussion of falls prevention and safety.   Cardiac Rehab from 09/13/2017 in [facility] Cardiac and Pulmonary Rehab  Date  09/13/17  Educator  [facility]  Instruction Review Code  1- Verbalizes Understanding      Diabetes: - Individual verbal and written instruction to review signs/symptoms of diabetes, desired ranges of glucose level fasting, after meals and with exercise. Acknowledge that pre and post exercise glucose checks will be done for 3 sessions at entry of program.   Cardiac Rehab from 09/13/2017 in [facility] Cardiac and Pulmonary Rehab  Date  09/13/17  Educator  [facility]  Instruction Review Code  1- Verbalizes Understanding      Know Your Numbers and Risk Factors: -Group verbal and written instruction about important numbers in your health.  Discussion of what are risk factors and how they play a role in the disease process.  Review of Cholesterol, Blood Pressure, Diabetes, and BMI and the role they play in your overall health.   Sleep Hygiene: -Provides group verbal and written instruction about how sleep can affect your health.  Define sleep hygiene, discuss sleep cycles and impact of sleep habits. Review good sleep hygiene tips.    Other: -Provides group and verbal instruction on various topics (see comments)   Knowledge Questionnaire Score: Knowledge Questionnaire Score -  09/13/17 1446      Knowledge Questionnaire Score   Pre Score  19/26  correct answers reviewed with Tomoki, focus on Exercise and Nutrition      Core Components/Risk Factors/Patient Goals at Admission: Personal Goals and Risk Factors at Admission - 09/13/17 1446      Core Components/Risk Factors/Patient Goals on Admission    Weight Management  Yes;Obesity;Weight Loss    Intervention  Weight Management: Develop a combined nutrition and exercise program designed to reach desired caloric intake, while maintaining appropriate intake of nutrient and fiber, sodium and fats, and appropriate energy expenditure required for the weight goal.;Weight Management: Provide education and appropriate resources to help participant work on and attain dietary goals.;Weight Management/Obesity: Establish reasonable short term and long term weight goals.;Obesity: Provide education and appropriate resources to help participant work on and attain dietary goals.    Admit Weight  312 lb (141.5 kg)    Goal Weight: Short Term  308 lb (139.7 kg)    Goal Weight: Long Term  270 lb (122.5 kg)    Expected Outcomes  Short Term: Continue to assess and modify interventions until short term weight is achieved;Long Term: Adherence to nutrition and physical activity/exercise program aimed toward attainment of established weight goal;Weight Loss: Understanding of general recommendations for a balanced deficit meal plan, which promotes 1-2 lb weight loss per week and includes a negative energy balance of [phone] kcal/d;Understanding recommendations for meals to include 15-35% energy as protein, 25-35% energy from fat, 35-60% energy from carbohydrates, less than '200mg'$  of dietary cholesterol, 20-35 gm of total fiber daily;Understanding of distribution of calorie intake throughout the day with the consumption of 4-5 meals/snacks    Diabetes  Yes    Intervention  Provide education about signs/symptoms and action to take for  hypo/hyperglycemia.;Provide education about proper nutrition, including hydration, and aerobic/resistive exercise prescription along with prescribed medications to achieve blood glucose in normal ranges: Fasting glucose 65-99 mg/dL    Expected Outcomes  Short Term: Participant verbalizes understanding of the signs/symptoms and immediate care of hyper/hypoglycemia, proper foot care and importance of medication, aerobic/resistive exercise and nutrition plan for blood glucose control.;Long Term: Attainment of HbA1C < 7%.    Heart Failure  Yes    Intervention  Provide a combined exercise and nutrition program that is supplemented with education, support and counseling about heart failure. Directed toward relieving symptoms such as shortness of breath, decreased exercise tolerance, and extremity edema.    Expected Outcomes  Improve functional capacity of life;Short term: Attendance in program 2-3 days a week with increased exercise capacity. Reported lower sodium intake. Reported increased fruit and vegetable intake. Reports medication compliance.;Short term: Daily weights obtained and reported for increase. Utilizing diuretic protocols set by physician.;Long term: Adoption of self-care skills and reduction of barriers for early signs and symptoms recognition and intervention leading to self-care maintenance.    Hypertension  Yes    Intervention  Provide education on lifestyle modifcations including regular physical activity/exercise, weight management, moderate sodium restriction and increased consumption of fresh fruit, vegetables, and low fat dairy, alcohol moderation, and smoking cessation.;Monitor prescription use compliance.    Expected Outcomes  Short Term: Continued assessment and intervention until BP is < 140/90m HG in hypertensive participants. < 130/846mHG in hypertensive participants with diabetes, heart failure or chronic kidney disease.;Long Term: Maintenance of blood pressure at goal levels.     Lipids  Yes    Intervention  Provide education and support for participant on nutrition & aerobic/resistive exercise along with prescribed medications to achieve LDL '70mg'$ , HDL >'40mg'$ .    Expected Outcomes  Short  Term: Participant states understanding of desired cholesterol values and is compliant with medications prescribed. Participant is following exercise prescription and nutrition guidelines.;Long Term: Cholesterol controlled with medications as prescribed, with individualized exercise RX and with personalized nutrition plan. Value goals: LDL < '70mg'$ , HDL > 40 mg.       Core Components/Risk Factors/Patient Goals Review:    Core Components/Risk Factors/Patient Goals at Discharge (Final Review):    ITP Comments: ITP Comments    Row Name 09/13/17 1442 09/13/17 1453 09/19/17 0910 10/09/17 1554 10/17/17 0546   ITP Comments  Med Review completed. Initial ITP created. Diagnosis can be found in Media Tab from [place] encounter 7/22  Catarino completed 2 min of the 6 min walk and needed to rest. While sitting, it was noted his heart rate was sustaining in 130s. SOB was relieved with rest and he thought it was because he usually gets SOB with movement. He rested in chair for 15 minutes and HR still maintained int he 130s. Patient brought upstairs to ED to be evaluated.   30 day review completed. ITP sent to Dr. Ramonita Lab, covering for Dr. Emily Filbert, Medical Director of Cardiac Rehab. Continue with ITP unless changes are made by physician.  new to program. Has not started exercise yet.   Dr. Doren Custard called to let us know that he has a follow up appointment with Jyden next week.  Mr. Stucke was just discharged last week from a gout flare up and had some SVT while admitted then.  Dr. Doren Custard is considering a stress test prior to releasing him to start rehab.  He will let us know when Mr. Rochford can start after his appointment next week.   30 day review completed. ITP sent to Dr. Emily Filbert, Medical Director of  Cardiac Rehab. Continue with ITP unless changes are made by physician  HAs attended med review,out for medical concerns      Comments:

## 2017-10-24 ENCOUNTER — Encounter: Payer: Self-pay | Admitting: *Deleted

## 2017-10-24 ENCOUNTER — Telehealth: Payer: Self-pay | Admitting: *Deleted

## 2017-10-24 DIAGNOSIS — I5022 Chronic systolic (congestive) heart failure: Secondary | ICD-10-CM

## 2017-10-24 NOTE — Telephone Encounter (Signed)
Dr. Doren Custard called to update [place] on Trevor Jennings.  He was supposed to do a stress test yesterday, but present volume overloaded.  Dr. Doren Custard made some medication adjustements to try to optimize him.  He will have Trevor Jennings come back in 2 weeks for his stress test and will update [place] on his status after that.

## 2017-10-25 ENCOUNTER — Encounter: Payer: Self-pay | Admitting: Podiatry

## 2017-10-25 ENCOUNTER — Ambulatory Visit (INDEPENDENT_AMBULATORY_CARE_PROVIDER_SITE_OTHER): Payer: No Typology Code available for payment source | Admitting: Podiatry

## 2017-10-25 DIAGNOSIS — M79674 Pain in right toe(s): Secondary | ICD-10-CM

## 2017-10-25 DIAGNOSIS — M79675 Pain in left toe(s): Secondary | ICD-10-CM

## 2017-10-25 DIAGNOSIS — E1143 Type 2 diabetes mellitus with diabetic autonomic (poly)neuropathy: Secondary | ICD-10-CM

## 2017-10-25 DIAGNOSIS — L989 Disorder of the skin and subcutaneous tissue, unspecified: Secondary | ICD-10-CM

## 2017-10-25 DIAGNOSIS — B351 Tinea unguium: Secondary | ICD-10-CM

## 2017-10-25 NOTE — Progress Notes (Addendum)
This patient presents to the office with chief complaint of long thick nails and diabetic feet.  This patient  says there  is  no pain and discomfort in their feet.  This patient says there are long thick painful nails.  These nails are painful walking and wearing shoes.  Patient has no history of infection or drainage from both feet. Patient has history of skin injury on the bottom of left foot requiring treatment of his left foot after soaking his feet.   Patient is unable to  self treat his own nails . This patient presents  to the office today for treatment of the  long nails and a foot evaluation due to history of  Diabetes.  Patient also has skin lesion bottom of right big toe.  No pain.  General Appearance  Alert, conversant and in no acute stress.  Vascular  Dorsalis pedis are  weakly palpable  bilaterally. Posterior tibial pulses are absent due to swelling both ankles. Capillary return is within normal limits  bilaterally. Temperature is within normal limits  bilaterally.  Neurologic  Senn-Weinstein monofilament wire test absent   bilaterally. Muscle power within normal limits bilaterally.  Nails Thick disfigured discolored nails with subungual debris  from hallux to fifth toes bilaterally. No evidence of bacterial infection or drainage bilaterally.  Orthopedic  No limitations of motion of motion feet .  No crepitus or effusions noted.  No bony pathology or digital deformities noted.  Skin  normotropic skin with no porokeratosis noted bilaterally.  No signs of infections or ulcers noted.   Thick healed skin left forefoot.  Skin lesion noted right hallux      Onychomycosis  Diabetes with neuropathy.  Skin lesion right hallux.    IE  Debride nails x 10.  A diabetic foot exam was performed and there is evidence of vascular and neurological pathology  B/L.  Discussed the skin lesion with this patient .  A picture was taken of the skin lesion and will be shown to the other doctors in the  practice.  I am concerned that this may need to be biopsied and sent to the lab.  The wife is to monitor any size changes.  Patient will be called if we need to perform the biopsy on his right big toe.    RTC 3 months. Addendum 9/20  Conferred with Dr.  March Rummage and we decided to watch this lesion.  If there are changes then biopsy lesion.   gam  Gardiner Barefoot DPM

## 2017-10-31 ENCOUNTER — Telehealth: Payer: Self-pay | Admitting: Podiatry

## 2017-10-31 NOTE — Telephone Encounter (Signed)
Please fax notes to [place] @ [phone] Attn: Team 4

## 2017-11-07 ENCOUNTER — Encounter: Payer: No Typology Code available for payment source | Attending: Internal Medicine

## 2017-11-07 DIAGNOSIS — Z87891 Personal history of nicotine dependence: Secondary | ICD-10-CM | POA: Insufficient documentation

## 2017-11-07 DIAGNOSIS — I5022 Chronic systolic (congestive) heart failure: Secondary | ICD-10-CM | POA: Insufficient documentation

## 2017-11-14 ENCOUNTER — Encounter: Payer: Self-pay | Admitting: *Deleted

## 2017-11-14 DIAGNOSIS — I5022 Chronic systolic (congestive) heart failure: Secondary | ICD-10-CM

## 2017-11-14 NOTE — Progress Notes (Signed)
Cardiac Individual Treatment Plan  Patient Details  Name: Trevor Jennings MRN: [id number] Date of Birth: 21-Apr-1940 Referring Provider:     Cardiac Rehab from 09/13/2017 in [facility]  [facility]      Initial Encounter Date:    Cardiac Rehab from 09/13/2017 in [facility] Cardiac and Pulmonary Rehab  Date  09/13/17      Visit Diagnosis: Heart failure, chronic systolic ([place])  Patient's Home Medications on Admission:  Current Outpatient Medications:  .  albuterol (PROVENTIL HFA;VENTOLIN HFA) 108 (90 Base) MCG/ACT inhaler, Inhale into the lungs., Disp: , Rfl:  .  aspirin EC 81 MG tablet, Take by mouth., Disp: , Rfl:  .  atorvastatin (LIPITOR) 80 MG tablet, Take by mouth., Disp: , Rfl:  .  carvedilol (COREG) 25 MG tablet, Take by mouth., Disp: , Rfl:  .  Cholecalciferol (VITAMIN D-1000 MAX ST) 1000 units tablet, Take by mouth., Disp: , Rfl:  .  citalopram (CELEXA) 40 MG tablet, Take by mouth., Disp: , Rfl:  .  fluticasone (FLONASE) 50 MCG/ACT nasal spray, 2 sprays by Each Nare route daily., Disp: , Rfl:  .  gabapentin (NEURONTIN) 100 MG capsule, Take by mouth., Disp: , Rfl:  .  insulin glargine (LANTUS) 100 UNIT/ML injection, Inject into the skin., Disp: , Rfl:  .  insulin regular (NOVOLIN R,HUMULIN R) 100 units/mL injection, Inject into the skin., Disp: , Rfl:  .  loratadine (CLARITIN) 10 MG tablet, Take 10 mg by mouth daily., Disp: , Rfl:  .  pantoprazole (PROTONIX) 40 MG tablet, Take by mouth., Disp: , Rfl:  .  torsemide (DEMADEX) 20 MG tablet, Take by mouth., Disp: , Rfl:   Past Medical History: No past medical history on file.  Tobacco Use: Social History   Tobacco Use  Smoking Status Former Smoker  . Last attempt to quit: 1995  . Years since quitting: 24.7  Smokeless Tobacco Never Used    Labs: Recent Review Flowsheet Data    There is no flowsheet data to display.       Exercise Target Goals: Exercise Program Goal: Individual exercise  prescription set using results from initial 6 min walk test and THRR while considering  patient's activity barriers and safety.   Exercise Prescription Goal: Initial exercise prescription builds to 30-45 minutes a day of aerobic activity, 2-3 days per week.  Home exercise guidelines will be given to patient during program as part of exercise prescription that the participant will acknowledge.  Activity Barriers & Risk Stratification: Activity Barriers & Cardiac Risk Stratification - 09/13/17 1455      Activity Barriers & Cardiac Risk Stratification   Activity Barriers  Back Problems;Shortness of Breath    Cardiac Risk Stratification  High       6 Minute Walk: 6 Minute Walk    Row Name 09/13/17 1426         6 Minute Walk   Distance  200 feet     Walk Time  2 minutes     MPH  1.13     RPE  13     Perceived Dyspnea   2     Symptoms  Yes (comment)     Comments  short of breath - stopped at 2:00      Resting HR  88 bpm     Resting BP  110/64     Resting Oxygen Saturation   94 %     Exercise Oxygen Saturation  during 6 min walk  97 %     Max Ex. HR  144 bpm     Max Ex. BP  124/60     2 Minute Post BP  110/56        Oxygen Initial Assessment:   Oxygen Re-Evaluation:   Oxygen Discharge (Final Oxygen Re-Evaluation):   Initial Exercise Prescription: Initial Exercise Prescription - 09/13/17 1400      Date of Initial Exercise RX and Referring Provider   Date  09/13/17    [facility] Provider  [facility]      Treadmill   MPH  0.8    Grade  0    Minutes  15   rest as needed   METs  1      T5 Nustep   Level  1    SPM  60    Minutes  15      Biostep-RELP   Level  1    SPM  50    Minutes  15    METs  1      Track   Laps  10    Minutes  15   rest as needed   METs  1      Prescription Details   Frequency (times per week)  3    Duration  Progress to 45 minutes of aerobic exercise without signs/symptoms of physical distress      Intensity   THRR 40-80% of Max  Heartrate  110-132    Ratings of Perceived Exertion  11-13    Perceived Dyspnea  0-4      Resistance Training   Training Prescription  Yes    Weight  3 lb    Reps  10-15       Perform Capillary Blood Glucose checks as needed.  Exercise Prescription Changes: Exercise Prescription Changes    Row Name 09/13/17 1400             Response to Exercise   Blood Pressure (Admit)  110/64       Blood Pressure (Exercise)  124/60       Blood Pressure (Exit)  110/56       Heart Rate (Admit)  81 bpm       Heart Rate (Exercise)  144 bpm       Heart Rate (Exit)  133 bpm       Oxygen Saturation (Admit)  96 %       Oxygen Saturation (Exercise)  98 %       Oxygen Saturation (Exit)  97 %       Rating of Perceived Exertion (Exercise)  13       Perceived Dyspnea (Exercise)  2          Exercise Comments:   Exercise Goals and Review: Exercise Goals    Row Name 09/13/17 1425             Exercise Goals   Increase Physical Activity  Yes       Intervention  Provide advice, education, support and counseling about physical activity/exercise needs.;Develop an individualized exercise prescription for aerobic and resistive training based on initial evaluation findings, risk stratification, comorbidities and participant's personal goals.       Expected Outcomes  Short Term: Attend rehab on a regular basis to increase amount of physical activity.;Long Term: Add in home exercise to make exercise part of routine and to increase amount of physical activity.;Long Term: Exercising regularly at least 3-5 days a week.  Increase Strength and Stamina  Yes       Intervention  Provide advice, education, support and counseling about physical activity/exercise needs.;Develop an individualized exercise prescription for aerobic and resistive training based on initial evaluation findings, risk stratification, comorbidities and participant's personal goals.       Expected Outcomes  Short Term: Increase workloads  from initial exercise prescription for resistance, speed, and METs.;Short Term: Perform resistance training exercises routinely during rehab and add in resistance training at home;Long Term: Improve cardiorespiratory fitness, muscular endurance and strength as measured by increased METs and functional capacity (6MWT)       Able to understand and use rate of perceived exertion (RPE) scale  Yes       Intervention  Provide education and explanation on how to use RPE scale       Expected Outcomes  Short Term: Able to use RPE daily in rehab to express subjective intensity level;Long Term:  Able to use RPE to guide intensity level when exercising independently       Able to understand and use Dyspnea scale  Yes       Intervention  Provide education and explanation on how to use Dyspnea scale       Expected Outcomes  Short Term: Able to use Dyspnea scale daily in rehab to express subjective sense of shortness of breath during exertion;Long Term: Able to use Dyspnea scale to guide intensity level when exercising independently       Knowledge and understanding of Target Heart Rate Range (THRR)  Yes       Intervention  Provide education and explanation of THRR including how the numbers were predicted and where they are located for reference       Expected Outcomes  Short Term: Able to state/look up THRR;Long Term: Able to use THRR to govern intensity when exercising independently;Short Term: Able to use daily as guideline for intensity in rehab       Able to check pulse independently  Yes       Intervention  Provide education and demonstration on how to check pulse in carotid and radial arteries.;Review the importance of being able to check your own pulse for safety during independent exercise       Expected Outcomes  Short Term: Able to explain why pulse checking is important during independent exercise;Long Term: Able to check pulse independently and accurately       Understanding of Exercise Prescription  Yes        Intervention  Provide education, explanation, and written materials on patient's individual exercise prescription       Expected Outcomes  Short Term: Able to explain program exercise prescription;Long Term: Able to explain home exercise prescription to exercise independently          Exercise Goals Re-Evaluation :   Discharge Exercise Prescription (Final Exercise Prescription Changes): Exercise Prescription Changes - 09/13/17 1400      Response to Exercise   Blood Pressure (Admit)  110/64    Blood Pressure (Exercise)  124/60    Blood Pressure (Exit)  110/56    Heart Rate (Admit)  81 bpm    Heart Rate (Exercise)  144 bpm    Heart Rate (Exit)  133 bpm    Oxygen Saturation (Admit)  96 %    Oxygen Saturation (Exercise)  98 %    Oxygen Saturation (Exit)  97 %    Rating of Perceived Exertion (Exercise)  13    Perceived Dyspnea (Exercise)  2  Nutrition:  Target Goals: Understanding of nutrition guidelines, daily intake of sodium <1543m, cholesterol <209m calories 30% from fat and 7% or less from saturated fats, daily to have 5 or more servings of fruits and vegetables.  Biometrics: Pre Biometrics - 09/13/17 1424      Pre Biometrics   Height  _0  (1.778 m)    Weight  (!) 312 lb 4.8 oz (141.7 kg)    Waist Circumference  55 inches    Hip Circumference  54 inches    Waist to Hip Ratio  1.02 %    BMI (Calculated)  44.81        Nutrition Therapy Plan and Nutrition Goals: Nutrition Therapy & Goals - 09/13/17 1445      Intervention Plan   Intervention  Prescribe, educate and counsel regarding individualized specific dietary modifications aiming towards targeted core components such as weight, hypertension, lipid management, diabetes, heart failure and other comorbidities.;Nutrition handout(s) given to patient.    Expected Outcomes  Short Term Goal: Understand basic principles of dietary content, such as calories, fat, sodium, cholesterol and nutrients.;Long Term  Goal: Adherence to prescribed nutrition plan.;Short Term Goal: A plan has been developed with personal nutrition goals set during dietitian appointment.       Nutrition Assessments: Nutrition Assessments - 09/13/17 1445      MEDFICTS Scores   Pre Score  58       Nutrition Goals Re-Evaluation:   Nutrition Goals Discharge (Final Nutrition Goals Re-Evaluation):   Psychosocial: Target Goals: Acknowledge presence or absence of significant depression and/or stress, maximize coping skills, provide positive support system. Participant is able to verbalize types and ability to use techniques and skills needed for reducing stress and depression.   Initial Review & Psychosocial Screening: Initial Psych Review & Screening - 09/13/17 1443      Initial Review   Current issues with  Current Stress Concerns    Source of Stress Concerns  Chronic Illness;Unable to perform yard/household activities;Unable to participate in former interests or hobbies      [place] Yes   wife     Barriers   Psychosocial barriers to participate in program  There are no identifiable barriers or psychosocial needs.;The patient should benefit from training in stress management and relaxation.      Screening Interventions   Interventions  Encouraged to exercise;Program counselor consult;To provide support and resources with identified psychosocial needs;Provide feedback about the scores to participant    Expected Outcomes  Short Term goal: Utilizing psychosocial counselor, staff and physician to assist with identification of specific Stressors or current issues interfering with healing process. Setting desired goal for each stressor or current issue identified.;Long Term Goal: Stressors or current issues are controlled or eliminated.;Short Term goal: Identification and review with participant of any Quality of Life or Depression concerns found by scoring the questionnaire.;Long Term goal:  The participant improves quality of Life and PHQ9 Scores as seen by post scores and/or verbalization of changes       Quality of Life Scores:  Quality of Life - 09/13/17 1444      Quality of Life   Select  Quality of Life      Quality of Life Scores   Health/Function Pre  12.29 %    Socioeconomic Pre  19.83 %    Psych/Spiritual Pre  25.57 %    Family Pre  19.3 %    GLOBAL Pre  17.7 %  Scores of 19 and below usually indicate a poorer quality of life in these areas.  A difference of  2-3 points is a clinically meaningful difference.  A difference of 2-3 points in the total score of the Quality of Life Index has been associated with significant improvement in overall quality of life, self-image, physical symptoms, and general health in studies assessing change in quality of life.  PHQ-9: Recent Review Flowsheet Data    Depression screen [facility] 2/9 09/13/2017   Decreased Interest 2   Down, Depressed, Hopeless 2   PHQ - 2 Score 4   Altered sleeping 2   Tired, decreased energy 3   Change in appetite 0   Feeling bad or failure about yourself  0   Trouble concentrating 2   Moving slowly or fidgety/restless 0   Suicidal thoughts 0   PHQ-9 Score 11   Difficult doing work/chores Somewhat difficult     Interpretation of Total Score  Total Score Depression Severity:  1-4 = Minimal depression, 5-9 = Mild depression, 10-14 = Moderate depression, 15-19 = Moderately severe depression, 20-27 = Severe depression   Psychosocial Evaluation and Intervention:   Psychosocial Re-Evaluation:   Psychosocial Discharge (Final Psychosocial Re-Evaluation):   Vocational Rehabilitation: Provide vocational rehab assistance to qualifying candidates.   Vocational Rehab Evaluation & Intervention: Vocational Rehab - 09/13/17 1446      Initial Vocational Rehab Evaluation & Intervention   Assessment shows need for Vocational Rehabilitation  No       Education: Education Goals: Education classes  will be provided on a variety of topics geared toward better understanding of heart health and risk factor modification. Participant will state understanding/return demonstration of topics presented as noted by education test scores.  Learning Barriers/Preferences: Learning Barriers/Preferences - 09/13/17 1445      Learning Barriers/Preferences   Learning Barriers  Hearing    Learning Preferences  Computer/Internet;Individual Instruction       Education Topics:  AED/CPR: - Group verbal and written instruction with the use of models to demonstrate the basic use of the AED with the basic ABC's of resuscitation.   General Nutrition Guidelines/Fats and Fiber: -Group instruction provided by verbal, written material, models and posters to present the general guidelines for heart healthy nutrition. Gives an explanation and review of dietary fats and fiber.   Controlling Sodium/Reading Food Labels: -Group verbal and written material supporting the discussion of sodium use in heart healthy nutrition. Review and explanation with models, verbal and written materials for utilization of the food label.   Exercise Physiology & General Exercise Guidelines: - Group verbal and written instruction with models to review the exercise physiology of the cardiovascular system and associated critical values. Provides general exercise guidelines with specific guidelines to those with heart or lung disease.    Aerobic Exercise & Resistance Training: - Gives group verbal and written instruction on the various components of exercise. Focuses on aerobic and resistive training programs and the benefits of this training and how to safely progress through these programs..   Flexibility, Balance, Mind/Body Relaxation: Provides group verbal/written instruction on the benefits of flexibility and balance training, including mind/body exercise modes such as yoga, pilates and tai chi.  Demonstration and skill practice  provided.   Stress and Anxiety: - Provides group verbal and written instruction about the health risks of elevated stress and causes of high stress.  Discuss the correlation between heart/lung disease and anxiety and treatment options. Review healthy ways to manage with stress and anxiety.  Depression: - Provides group verbal and written instruction on the correlation between heart/lung disease and depressed mood, treatment options, and the stigmas associated with seeking treatment.   Anatomy & Physiology of the Heart: - Group verbal and written instruction and models provide basic cardiac anatomy and physiology, with the coronary electrical and arterial systems. Review of Valvular disease and Heart Failure   Cardiac Procedures: - Group verbal and written instruction to review commonly prescribed medications for heart disease. Reviews the medication, class of the drug, and side effects. Includes the steps to properly store meds and maintain the prescription regimen. (beta blockers and nitrates)   Cardiac Medications I: - Group verbal and written instruction to review commonly prescribed medications for heart disease. Reviews the medication, class of the drug, and side effects. Includes the steps to properly store meds and maintain the prescription regimen.   Cardiac Medications II: -Group verbal and written instruction to review commonly prescribed medications for heart disease. Reviews the medication, class of the drug, and side effects. (all other drug classes)    Go Sex-Intimacy & Heart Disease, Get SMART - Goal Setting: - Group verbal and written instruction through game format to discuss heart disease and the return to sexual intimacy. Provides group verbal and written material to discuss and apply goal setting through the application of the S.M.A.R.T. Method.   Other Matters of the Heart: - Provides group verbal, written materials and models to describe Stable Angina and  Peripheral Artery. Includes description of the disease process and treatment options available to the cardiac patient.   Exercise & Equipment Safety: - Individual verbal instruction and demonstration of equipment use and safety with use of the equipment.   Cardiac Rehab from 09/13/2017 in [facility] Cardiac and Pulmonary Rehab  Date  09/13/17  Educator  [facility]  Instruction Review Code  1- Verbalizes Understanding      Infection Prevention: - Provides verbal and written material to individual with discussion of infection control including proper hand washing and proper equipment cleaning during exercise session.   Cardiac Rehab from 09/13/2017 in [facility] Cardiac and Pulmonary Rehab  Date  09/13/17  Educator  [facility]  Instruction Review Code  1- Verbalizes Understanding      Falls Prevention: - Provides verbal and written material to individual with discussion of falls prevention and safety.   Cardiac Rehab from 09/13/2017 in [facility] Cardiac and Pulmonary Rehab  Date  09/13/17  Educator  [facility]  Instruction Review Code  1- Verbalizes Understanding      Diabetes: - Individual verbal and written instruction to review signs/symptoms of diabetes, desired ranges of glucose level fasting, after meals and with exercise. Acknowledge that pre and post exercise glucose checks will be done for 3 sessions at entry of program.   Cardiac Rehab from 09/13/2017 in [facility] Cardiac and Pulmonary Rehab  Date  09/13/17  Educator  [facility]  Instruction Review Code  1- Verbalizes Understanding      Know Your Numbers and Risk Factors: -Group verbal and written instruction about important numbers in your health.  Discussion of what are risk factors and how they play a role in the disease process.  Review of Cholesterol, Blood Pressure, Diabetes, and BMI and the role they play in your overall health.   Sleep Hygiene: -Provides group verbal and written instruction about how sleep can affect your health.  Define sleep hygiene, discuss sleep  cycles and impact of sleep habits. Review good sleep hygiene tips.    Other: -Provides group and verbal  instruction on various topics (see comments)   Knowledge Questionnaire Score: Knowledge Questionnaire Score - 09/13/17 1446      Knowledge Questionnaire Score   Pre Score  19/26   correct answers reviewed with [place], focus on Exercise and Nutrition      Core Components/Risk Factors/Patient Goals at Admission: Personal Goals and Risk Factors at Admission - 09/13/17 1446      Core Components/Risk Factors/Patient Goals on Admission    Weight Management  Yes;Obesity;Weight Loss    Intervention  Weight Management: Develop a combined nutrition and exercise program designed to reach desired caloric intake, while maintaining appropriate intake of nutrient and fiber, sodium and fats, and appropriate energy expenditure required for the weight goal.;Weight Management: Provide education and appropriate resources to help participant work on and attain dietary goals.;Weight Management/Obesity: Establish reasonable short term and long term weight goals.;Obesity: Provide education and appropriate resources to help participant work on and attain dietary goals.    Admit Weight  312 lb (141.5 kg)    Goal Weight: Short Term  308 lb (139.7 kg)    Goal Weight: Long Term  270 lb (122.5 kg)    Expected Outcomes  Short Term: Continue to assess and modify interventions until short term weight is achieved;Long Term: Adherence to nutrition and physical activity/exercise program aimed toward attainment of established weight goal;Weight Loss: Understanding of general recommendations for a balanced deficit meal plan, which promotes 1-2 lb weight loss per week and includes a negative energy balance of [phone] kcal/d;Understanding recommendations for meals to include 15-35% energy as protein, 25-35% energy from fat, 35-60% energy from carbohydrates, less than 272m of dietary cholesterol, 20-35 gm of total fiber  daily;Understanding of distribution of calorie intake throughout the day with the consumption of 4-5 meals/snacks    Diabetes  Yes    Intervention  Provide education about signs/symptoms and action to take for hypo/hyperglycemia.;Provide education about proper nutrition, including hydration, and aerobic/resistive exercise prescription along with prescribed medications to achieve blood glucose in normal ranges: Fasting glucose 65-99 mg/dL    Expected Outcomes  Short Term: Participant verbalizes understanding of the signs/symptoms and immediate care of hyper/hypoglycemia, proper foot care and importance of medication, aerobic/resistive exercise and nutrition plan for blood glucose control.;Long Term: Attainment of HbA1C < 7%.    Heart Failure  Yes    Intervention  Provide a combined exercise and nutrition program that is supplemented with education, support and counseling about heart failure. Directed toward relieving symptoms such as shortness of breath, decreased exercise tolerance, and extremity edema.    Expected Outcomes  Improve functional capacity of life;Short term: Attendance in program 2-3 days a week with increased exercise capacity. Reported lower sodium intake. Reported increased fruit and vegetable intake. Reports medication compliance.;Short term: Daily weights obtained and reported for increase. Utilizing diuretic protocols set by physician.;Long term: Adoption of self-care skills and reduction of barriers for early signs and symptoms recognition and intervention leading to self-care maintenance.    Hypertension  Yes    Intervention  Provide education on lifestyle modifcations including regular physical activity/exercise, weight management, moderate sodium restriction and increased consumption of fresh fruit, vegetables, and low fat dairy, alcohol moderation, and smoking cessation.;Monitor prescription use compliance.    Expected Outcomes  Short Term: Continued assessment and intervention  until BP is < 140/951mHG in hypertensive participants. < 130/806mG in hypertensive participants with diabetes, heart failure or chronic kidney disease.;Long Term: Maintenance of blood pressure at goal levels.    Lipids  Yes  Intervention  Provide education and support for participant on nutrition & aerobic/resistive exercise along with prescribed medications to achieve LDL <77m, HDL >467m    Expected Outcomes  Short Term: Participant states understanding of desired cholesterol values and is compliant with medications prescribed. Participant is following exercise prescription and nutrition guidelines.;Long Term: Cholesterol controlled with medications as prescribed, with individualized exercise RX and with personalized nutrition plan. Value goals: LDL < 7086mHDL > 40 mg.       Core Components/Risk Factors/Patient Goals Review:    Core Components/Risk Factors/Patient Goals at Discharge (Final Review):    ITP Comments: ITP Comments    Row Name 09/13/17 1442 09/13/17 1453 09/19/17 0910 10/09/17 1554 10/17/17 0546   ITP Comments  Med Review completed. Initial ITP created. [facility]  Zac[place] completed 2 min of the 6 min walk and needed to rest. While sitting, it was noted his heart rate was sustaining in 130s. SOB was relieved with rest and he thought it was because he usually gets SOB with movement. He rested in chair for 15 minutes and HR still maintained int he 130s. Patient brought upstairs to ED to be evaluated.   30 day review completed. ITP sent to Dr. BerRamonita Labovering for Dr. MarEmily Filbertedical Director of Cardiac Rehab. Continue with ITP unless changes are made by physician.  new to program. Has not started exercise yet.   Dr. DixDoren Custardlled to let us [place] that he has a follow up appointment with Zaydon next week.  Mr. DunKuenzels just discharged last week from a gout flare up and had some SVT while admitted then.  Dr. DixDoren Custard  considering a stress test prior to releasing him to start rehab.  He will let us [place] when Mr. DunLiskan start after his appointment next week.   30 day review completed. ITP sent to Dr. MarEmily Filbertedical Director of Cardiac Rehab. Continue with ITP unless changes are made by physician  HAs attended med review,out for medical concerns   RowGearhartme 10/24/17 1529 11/14/17 [id number]      ITP Comments  Dr. DixDoren Custardlled to update us [place] Mr. DunLoppnowHe was supposed to do a stress test yesterday, but present volume overloaded.  Dr. DixDoren Custardde some medication adjustements to try to optimize him.  He will have Mr. DunCoaxumme back in 2 weeks for his stress test and will update us [place] his status after that.   30 day review.  Continue with ITP unless directed changes per Medical Director review.  Remians out for medical reasons         Comments:

## 2017-12-05 ENCOUNTER — Encounter: Payer: Self-pay | Admitting: *Deleted

## 2017-12-05 ENCOUNTER — Telehealth: Payer: Self-pay | Admitting: *Deleted

## 2017-12-05 DIAGNOSIS — I5022 Chronic systolic (congestive) heart failure: Secondary | ICD-10-CM

## 2017-12-05 NOTE — Telephone Encounter (Signed)
Received clearance for Olan to start rehab.  Left message on voice mail at home to call to schedule first day.

## 2017-12-10 ENCOUNTER — Encounter: Payer: No Typology Code available for payment source | Attending: Internal Medicine | Admitting: *Deleted

## 2017-12-10 VITALS — Ht 70.0 in | Wt 310.4 lb

## 2017-12-10 DIAGNOSIS — Z87891 Personal history of nicotine dependence: Secondary | ICD-10-CM | POA: Insufficient documentation

## 2017-12-10 DIAGNOSIS — I5022 Chronic systolic (congestive) heart failure: Secondary | ICD-10-CM | POA: Diagnosis not present

## 2017-12-10 NOTE — Progress Notes (Signed)
Daily Session Note  Patient Details  Name: Trevor Jennings MRN: [id number] Date of Birth: 1940/11/09 Referring Provider:     Cardiac Rehab from 09/13/2017 in [facility]  [facility]      Encounter Date: 12/10/2017  Check In: Session Check In - 12/10/17 1636      Check-In   Supervising physician immediately available to respond to emergencies  See telemetry face sheet for immediately available ER MD    Location  [facility] Pul[facility]    Staff Present  Earlean Shawl, BS, ACSM CEP, Exercise Physiologist;Susanne Bice, RN, BSN, CCRP;Mary Kellie Shropshire, RN, BSN, MA;Other    Medication changes reported      Yes    Comments  Patient reported many med changes since last visit (he has been hospitalized 2 times since last visit) He does not know what all the changes are but is going to bring an updated med list to his next visit.     Fall or balance concerns reported     No    Tobacco Cessation  No Change    Warm-up and Cool-down  Not performed (comment)   6 min walk only done today   Resistance Training Performed  No   6 min walk only done today   VAD Patient?  No    PAD/SET Patient?  No      Pain Assessment   Currently in Pain?  No/denies    Multiple Pain Sites  No        Exercise Prescription Changes - 12/10/17 1600      Response to Exercise   Blood Pressure (Admit)  110/70    Blood Pressure (Exercise)  140/70    Blood Pressure (Exit)  140/84    Heart Rate (Admit)  64 bpm    Heart Rate (Exercise)  77 bpm    Heart Rate (Exit)  77 bpm    Oxygen Saturation (Admit)  99 %    Oxygen Saturation (Exercise)  100 %    Oxygen Saturation (Exit)  100 %    Rating of Perceived Exertion (Exercise)  13      Resistance Training   Training Prescription  Yes    Weight  3 lb    Reps  10-15      Treadmill   MPH  0.8    Grade  0    Minutes  15   rest as needed   METs  1      T5 Nustep   Level  1    SPM  60    Minutes  15      Biostep-RELP   Level  1    SPM  50    Minutes  15    METs  1      Track   Laps  10    Minutes  15   rest as needed   METs  1       Social History   Tobacco Use  Smoking Status Former Smoker  . Last attempt to quit: 1995  . Years since quitting: 24.8  Smokeless Tobacco Never Used    Goals Met:  Proper associated with RPD/PD & O2 Sat Exercise tolerated well No report of cardiac concerns or symptoms  Goals Unmet:  Not Applicable  Comments: Patient came today with clearance from doctor to resume rehab. It has been 2 months since his initial walk test so 6 min walk test was repeated today. Patient also  met with dietician and mental health counselor today.    Dr. Emily Filbert is Medical Director for [facility].

## 2017-12-12 ENCOUNTER — Encounter: Payer: Self-pay | Admitting: *Deleted

## 2017-12-12 ENCOUNTER — Encounter: Payer: Self-pay | Admitting: Dietician

## 2017-12-12 ENCOUNTER — Encounter: Payer: No Typology Code available for payment source | Admitting: *Deleted

## 2017-12-12 DIAGNOSIS — I5022 Chronic systolic (congestive) heart failure: Secondary | ICD-10-CM

## 2017-12-12 LAB — GLUCOSE, CAPILLARY
Glucose-Capillary: 233 mg/dL — ABNORMAL HIGH (ref 70–99)
Glucose-Capillary: 180 mg/dL — ABNORMAL HIGH (ref 70–99)

## 2017-12-12 NOTE — Progress Notes (Signed)
Cardiac Individual Treatment Plan  Patient Details  Name: Trevor Jennings MRN: [id number] Date of Birth: 03-16-1940 Referring Provider:     Cardiac Rehab from 09/13/2017 in [facility]  [place]  [facility]      Initial Encounter Date:    Cardiac Rehab from 09/13/2017 in [facility] Cardiac and Pulmonary Rehab  Date  09/13/17      Visit Diagnosis: Heart failure, chronic systolic ([facility])  Patient's Home Medications on Admission:  Current Outpatient Medications:  .  albuterol (PROVENTIL HFA;VENTOLIN HFA) 108 (90 Base) MCG/ACT inhaler, Inhale into the lungs., Disp: , Rfl:  .  aspirin EC 81 MG tablet, Take by mouth., Disp: , Rfl:  .  atorvastatin (LIPITOR) 80 MG tablet, Take by mouth., Disp: , Rfl:  .  carvedilol (COREG) 25 MG tablet, Take by mouth., Disp: , Rfl:  .  Cholecalciferol (VITAMIN D-1000 MAX ST) 1000 units tablet, Take by mouth., Disp: , Rfl:  .  citalopram (CELEXA) 40 MG tablet, Take by mouth., Disp: , Rfl:  .  fluticasone (FLONASE) 50 MCG/ACT nasal spray, 2 sprays by Each Nare route daily., Disp: , Rfl:  .  gabapentin (NEURONTIN) 100 MG capsule, Take by mouth., Disp: , Rfl:  .  insulin glargine (LANTUS) 100 UNIT/ML injection, Inject into the skin., Disp: , Rfl:  .  insulin regular (NOVOLIN R,HUMULIN R) 100 units/mL injection, Inject into the skin., Disp: , Rfl:  .  loratadine (CLARITIN) 10 MG tablet, Take 10 mg by mouth daily., Disp: , Rfl:  .  pantoprazole (PROTONIX) 40 MG tablet, Take by mouth., Disp: , Rfl:  .  torsemide (DEMADEX) 20 MG tablet, Take by mouth., Disp: , Rfl:   Past Medical History: No past medical history on file.  Tobacco Use: Social History   Tobacco Use  Smoking Status Former Smoker  . Last attempt to quit: 1995  . Years since quitting: 24.8  Smokeless Tobacco Never Used    Labs: Recent Review Flowsheet Data    There is no flowsheet data to display.       Exercise Target Goals: Exercise Program Goal: Individual exercise  prescription set using results from initial 6 min walk test and THRR while considering  patient's activity barriers and safety.   Exercise Prescription Goal: Initial exercise prescription builds to 30-45 minutes a day of aerobic activity, 2-3 days per week.  Home exercise guidelines will be given to patient during program as part of exercise prescription that the participant will acknowledge.  Activity Barriers & Risk Stratification: Activity Barriers & Cardiac Risk Stratification - 09/13/17 1455      Activity Barriers & Cardiac Risk Stratification   Activity Barriers  Back Problems;Shortness of Breath    Cardiac Risk Stratification  High       6 Minute Walk: 6 Minute Walk    Row Name 09/13/17 1426 12/10/17 1646       6 Minute Walk   Phase  -  Initial    Distance  200 feet  380 feet    Walk Time  2 minutes  2.75 minutes    # of Rest Breaks  -  3    MPH  1.13  0.72    RPE  13  13    Perceived Dyspnea   2  -    Symptoms  Yes (comment)  Yes (comment)    Comments  short of breath - stopped at 2:00   shortness of breath and tiredness    Resting HR  88 bpm  64 bpm    Resting BP  110/64  110/70    Resting Oxygen Saturation   94 %  99 %    Exercise Oxygen Saturation  during 6 min walk  97 %  100 %    Max Ex. HR  144 bpm  77 bpm    Max Ex. BP  124/60  140/70    2 Minute Post BP  110/56  140/70       Oxygen Initial Assessment:   Oxygen Re-Evaluation:   Oxygen Discharge (Final Oxygen Re-Evaluation):   Initial Exercise Prescription: Initial Exercise Prescription - 09/13/17 1400      Date of Initial Exercise RX and Referring Provider   Date  09/13/17    [facility] Provider  [facility]      Treadmill   MPH  0.8    Grade  0    Minutes  15   rest as needed   METs  1      T5 Nustep   Level  1    SPM  60    Minutes  15      Biostep-RELP   Level  1    SPM  50    Minutes  15    METs  1      Track   Laps  10    Minutes  15   rest as needed   METs  1      Prescription  Details   Frequency (times per week)  3    Duration  Progress to 45 minutes of aerobic exercise without signs/symptoms of physical distress      Intensity   THRR 40-80% of Max Heartrate  110-132    Ratings of Perceived Exertion  11-13    Perceived Dyspnea  0-4      Resistance Training   Training Prescription  Yes    Weight  3 lb    Reps  10-15       Perform Capillary Blood Glucose checks as needed.  Exercise Prescription Changes: Exercise Prescription Changes    Row Name 09/13/17 1400 12/10/17 1600           Response to Exercise   Blood Pressure (Admit)  110/64  110/70      Blood Pressure (Exercise)  124/60  140/70      Blood Pressure (Exit)  110/56  104/70      Heart Rate (Admit)  81 bpm  64 bpm      Heart Rate (Exercise)  144 bpm  77 bpm      Heart Rate (Exit)  133 bpm  65 bpm      Oxygen Saturation (Admit)  96 %  99 %      Oxygen Saturation (Exercise)  98 %  100 %      Oxygen Saturation (Exit)  97 %  100 %      Rating of Perceived Exertion (Exercise)  13  13      Perceived Dyspnea (Exercise)  2  -        Resistance Training   Training Prescription  -  Yes      Weight  -  3 lb      Reps  -  10-15        Treadmill   MPH  -  0.8      Grade  -  0      Minutes  -  15 rest as needed  METs  -  1        T5 Nustep   Level  -  1      SPM  -  60      Minutes  -  15        Biostep-RELP   Level  -  1      SPM  -  50      Minutes  -  15      METs  -  1        Track   Laps  -  10      Minutes  -  15 rest as needed      METs  -  1         Exercise Comments:   Exercise Goals and Review: Exercise Goals    Row Name 09/13/17 1425             Exercise Goals   Increase Physical Activity  Yes       Intervention  Provide advice, education, support and counseling about physical activity/exercise needs.;Develop an individualized exercise prescription for aerobic and resistive training based on initial evaluation findings, risk stratification, comorbidities  and participant's personal goals.       Expected Outcomes  Short Term: Attend rehab on a regular basis to increase amount of physical activity.;Long Term: Add in home exercise to make exercise part of routine and to increase amount of physical activity.;Long Term: Exercising regularly at least 3-5 days a week.       Increase Strength and Stamina  Yes       Intervention  Provide advice, education, support and counseling about physical activity/exercise needs.;Develop an individualized exercise prescription for aerobic and resistive training based on initial evaluation findings, risk stratification, comorbidities and participant's personal goals.       Expected Outcomes  Short Term: Increase workloads from initial exercise prescription for resistance, speed, and METs.;Short Term: Perform resistance training exercises routinely during rehab and add in resistance training at home;Long Term: Improve cardiorespiratory fitness, muscular endurance and strength as measured by increased METs and functional capacity (6MWT)       Able to understand and use rate of perceived exertion (RPE) scale  Yes       Intervention  Provide education and explanation on how to use RPE scale       Expected Outcomes  Short Term: Able to use RPE daily in rehab to express subjective intensity level;Long Term:  Able to use RPE to guide intensity level when exercising independently       Able to understand and use Dyspnea scale  Yes       Intervention  Provide education and explanation on how to use Dyspnea scale       Expected Outcomes  Short Term: Able to use Dyspnea scale daily in rehab to express subjective sense of shortness of breath during exertion;Long Term: Able to use Dyspnea scale to guide intensity level when exercising independently       Knowledge and understanding of Target Heart Rate Range (THRR)  Yes       Intervention  Provide education and explanation of THRR including how the numbers were predicted and where they are  located for reference       Expected Outcomes  Short Term: Able to state/look up THRR;Long Term: Able to use THRR to govern intensity when exercising independently;Short Term: Able to use daily as guideline for intensity in rehab  Able to check pulse independently  Yes       Intervention  Provide education and demonstration on how to check pulse in carotid and radial arteries.;Review the importance of being able to check your own pulse for safety during independent exercise       Expected Outcomes  Short Term: Able to explain why pulse checking is important during independent exercise;Long Term: Able to check pulse independently and accurately       Understanding of Exercise Prescription  Yes       Intervention  Provide education, explanation, and written materials on patient's individual exercise prescription       Expected Outcomes  Short Term: Able to explain program exercise prescription;Long Term: Able to explain home exercise prescription to exercise independently          Exercise Goals Re-Evaluation :   Discharge Exercise Prescription (Final Exercise Prescription Changes): Exercise Prescription Changes - 12/10/17 1600      Response to Exercise   Blood Pressure (Admit)  110/70    Blood Pressure (Exercise)  140/70    Blood Pressure (Exit)  104/70    Heart Rate (Admit)  64 bpm    Heart Rate (Exercise)  77 bpm    Heart Rate (Exit)  65 bpm    Oxygen Saturation (Admit)  99 %    Oxygen Saturation (Exercise)  100 %    Oxygen Saturation (Exit)  100 %    Rating of Perceived Exertion (Exercise)  13      Resistance Training   Training Prescription  Yes    Weight  3 lb    Reps  10-15      Treadmill   MPH  0.8    Grade  0    Minutes  15   rest as needed   METs  1      T5 Nustep   Level  1    SPM  60    Minutes  15      Biostep-RELP   Level  1    SPM  50    Minutes  15    METs  1      Track   Laps  10    Minutes  15   rest as needed   METs  1       Nutrition:   Target Goals: Understanding of nutrition guidelines, daily intake of sodium <155m, cholesterol <2056m calories 30% from fat and 7% or less from saturated fats, daily to have 5 or more servings of fruits and vegetables.  Biometrics: Pre Biometrics - 12/10/17 1651      Pre Biometrics   Height  5' 10"  (1.778 m)    Weight  (!) 310 lb 6.4 oz (140.8 kg)    Waist Circumference  55 inches    Hip Circumference  54 inches    Waist to Hip Ratio  1.02 %    BMI (Calculated)  44.54        Nutrition Therapy Plan and Nutrition Goals: Nutrition Therapy & Goals - 09/13/17 1445      Intervention Plan   Intervention  Prescribe, educate and counsel regarding individualized specific dietary modifications aiming towards targeted core components such as weight, hypertension, lipid management, diabetes, heart failure and other comorbidities.;Nutrition handout(s) given to patient.    Expected Outcomes  Short Term Goal: Understand basic principles of dietary content, such as calories, fat, sodium, cholesterol and nutrients.;Long Term Goal: Adherence to prescribed nutrition plan.;Short Term Goal: A plan  has been developed with personal nutrition goals set during dietitian appointment.       Nutrition Assessments: Nutrition Assessments - 09/13/17 1445      MEDFICTS Scores   Pre Score  58       Nutrition Goals Re-Evaluation:   Nutrition Goals Discharge (Final Nutrition Goals Re-Evaluation):   Psychosocial: Target Goals: Acknowledge presence or absence of significant depression and/or stress, maximize coping skills, provide positive support system. Participant is able to verbalize types and ability to use techniques and skills needed for reducing stress and depression.   Initial Review & Psychosocial Screening: Initial Psych Review & Screening - 09/13/17 1443      Initial Review   Current issues with  Current Stress Concerns    Source of Stress Concerns  Chronic Illness;Unable to perform  yard/household activities;Unable to participate in former interests or hobbies      [place]?  Yes   wife     Barriers   Psychosocial barriers to participate in program  There are no identifiable barriers or psychosocial needs.;The patient should benefit from training in stress management and relaxation.      Screening Interventions   Interventions  Encouraged to exercise;Program counselor consult;To provide support and resources with identified psychosocial needs;Provide feedback about the scores to participant    Expected Outcomes  Short Term goal: Utilizing psychosocial counselor, staff and physician to assist with identification of specific Stressors or current issues interfering with healing process. Setting desired goal for each stressor or current issue identified.;Long Term Goal: Stressors or current issues are controlled or eliminated.;Short Term goal: Identification and review with participant of any Quality of Life or Depression concerns found by scoring the questionnaire.;Long Term goal: The participant improves quality of Life and PHQ9 Scores as seen by post scores and/or verbalization of changes       Quality of Life Scores:  Quality of Life - 09/13/17 1444      Quality of Life   Select  Quality of Life      Quality of Life Scores   Health/Function Pre  12.29 %    Socioeconomic Pre  19.83 %    Psych/Spiritual Pre  25.57 %    Family Pre  19.3 %    GLOBAL Pre  17.7 %      Scores of 19 and below usually indicate a poorer quality of life in these areas.  A difference of  2-3 points is a clinically meaningful difference.  A difference of 2-3 points in the total score of the Quality of Life Index has been associated with significant improvement in overall quality of life, self-image, physical symptoms, and general health in studies assessing change in quality of life.  PHQ-9: Recent Review Flowsheet Data    Depression screen [facility] 2/9 12/10/2017  09/13/2017   Decreased Interest 0 2   Down, Depressed, Hopeless 2 2   PHQ - 2 Score 2 4   Altered sleeping 1 2   Tired, decreased energy 3 3   Change in appetite 1 0   Feeling bad or failure about yourself  2 0   Trouble concentrating 3 2   Moving slowly or fidgety/restless 3 0   Suicidal thoughts 0 0   PHQ-9 Score 15 11   Difficult doing work/chores Not difficult at all Somewhat difficult     Interpretation of Total Score  Total Score Depression Severity:  1-4 = Minimal depression, 5-9 = Mild depression, 10-14 = Moderate depression,  15-19 = Moderately severe depression, 20-27 = Severe depression   Psychosocial Evaluation and Intervention: Psychosocial Evaluation - 12/10/17 1739      Psychosocial Evaluation & Interventions   Interventions  Stress management education;Encouraged to exercise with the program and follow exercise prescription    Comments  Counselor met with Mr. Kreiter [facility]) accompanied by his spouse - Everlene Farrier today for an initial psychsocial evaluation.  He is a 77 year old who has COPD and has been in and out of the hospital since August - due to falling;dehydration;and low potassium.  He also struggles with diabetes and HBP.  Alben has a strong support system with his spouse, his daughter in [place]; & spouse's extensive family locally.  He sleeps well with the use of a CPAP and has a good appetite.  Nery reports a history of depression and is on medication to help with this. He states he is typically in a good mood but his spouse reports this being up and down at times with irritability on occasion.  His goals for this program are to feel stronger; have more energy; be able to walk and be more flexible to tie his own shoes.  He would like to lose some weight also if possible.  Counselor informed Tivon of his PHQ-9 scores of 15 -indicating moderately severe symptoms of depression with feeling down or depressed and feeling bad about himself in his current health condition.   Ruthvik also reported his sister passed away  yesterday and this could impact his scores/mood today.  Counselor encouraged Hyde to attend and exercise consistently; to find ways to manage his stress and have support in his grief at this time.  Counselor will follow with him.     Expected Outcomes  Short:  Hartwell will attend and exercise consistently for his health and mental health.  Presten will participate in the psychoeducational components of this program - including stress and depression to learn positive coping strategies.  Long:  Teng will develop a routine of positive self-care for his health and mental health.    Continue Psychosocial Services   Follow up required by counselor       Psychosocial Re-Evaluation:   Psychosocial Discharge (Final Psychosocial Re-Evaluation):   Vocational Rehabilitation: Provide vocational rehab assistance to qualifying candidates.   Vocational Rehab Evaluation & Intervention: Vocational Rehab - 09/13/17 1446      Initial Vocational Rehab Evaluation & Intervention   Assessment shows need for Vocational Rehabilitation  No       Education: Education Goals: Education classes will be provided on a variety of topics geared toward better understanding of heart health and risk factor modification. Participant will state understanding/return demonstration of topics presented as noted by education test scores.  Learning Barriers/Preferences: Learning Barriers/Preferences - 09/13/17 1445      Learning Barriers/Preferences   Learning Barriers  Hearing    Learning Preferences  Computer/Internet;Individual Instruction       Education Topics:  AED/CPR: - Group verbal and written instruction with the use of models to demonstrate the basic use of the AED with the basic ABC's of resuscitation.   General Nutrition Guidelines/Fats and Fiber: -Group instruction provided by verbal, written material, models and posters to present the general guidelines for  heart healthy nutrition. Gives an explanation and review of dietary fats and fiber.   Controlling Sodium/Reading Food Labels: -Group verbal and written material supporting the discussion of sodium use in heart healthy nutrition. Review and explanation with models, verbal and written materials for  utilization of the food label.   Exercise Physiology & General Exercise Guidelines: - Group verbal and written instruction with models to review the exercise physiology of the cardiovascular system and associated critical values. Provides general exercise guidelines with specific guidelines to those with heart or lung disease.    Aerobic Exercise & Resistance Training: - Gives group verbal and written instruction on the various components of exercise. Focuses on aerobic and resistive training programs and the benefits of this training and how to safely progress through these programs..   Flexibility, Balance, Mind/Body Relaxation: Provides group verbal/written instruction on the benefits of flexibility and balance training, including mind/body exercise modes such as yoga, pilates and tai chi.  Demonstration and skill practice provided.   Stress and Anxiety: - Provides group verbal and written instruction about the health risks of elevated stress and causes of high stress.  Discuss the correlation between heart/lung disease and anxiety and treatment options. Review healthy ways to manage with stress and anxiety.   Depression: - Provides group verbal and written instruction on the correlation between heart/lung disease and depressed mood, treatment options, and the stigmas associated with seeking treatment.   Anatomy & Physiology of the Heart: - Group verbal and written instruction and models provide basic cardiac anatomy and physiology, with the coronary electrical and arterial systems. Review of Valvular disease and Heart Failure   Cardiac Procedures: - Group verbal and written instruction to  review commonly prescribed medications for heart disease. Reviews the medication, class of the drug, and side effects. Includes the steps to properly store meds and maintain the prescription regimen. (beta blockers and nitrates)   Cardiac Medications I: - Group verbal and written instruction to review commonly prescribed medications for heart disease. Reviews the medication, class of the drug, and side effects. Includes the steps to properly store meds and maintain the prescription regimen.   Cardiac Medications II: -Group verbal and written instruction to review commonly prescribed medications for heart disease. Reviews the medication, class of the drug, and side effects. (all other drug classes)    Go Sex-Intimacy & Heart Disease, Get SMART - Goal Setting: - Group verbal and written instruction through game format to discuss heart disease and the return to sexual intimacy. Provides group verbal and written material to discuss and apply goal setting through the application of the S.M.A.R.T. Method.   Other Matters of the Heart: - Provides group verbal, written materials and models to describe Stable Angina and Peripheral Artery. Includes description of the disease process and treatment options available to the cardiac patient.   Exercise & Equipment Safety: - Individual verbal instruction and demonstration of equipment use and safety with use of the equipment.   Cardiac Rehab from 09/13/2017 in [facility] Cardiac and Pulmonary Rehab  Date  09/13/17  Educator  [facility]  Instruction Review Code  1- Verbalizes Understanding      Infection Prevention: - Provides verbal and written material to individual with discussion of infection control including proper hand washing and proper equipment cleaning during exercise session.   Cardiac Rehab from 09/13/2017 in [facility] Cardiac and Pulmonary Rehab  Date  09/13/17  Educator  [facility]  Instruction Review Code  1- Verbalizes Understanding      Falls Prevention: -  Provides verbal and written material to individual with discussion of falls prevention and safety.   Cardiac Rehab from 09/13/2017 in [facility] Cardiac and Pulmonary Rehab  Date  09/13/17  Educator  [facility]  Instruction Review Code  1- Verbalizes Understanding  Diabetes: - Individual verbal and written instruction to review signs/symptoms of diabetes, desired ranges of glucose level fasting, after meals and with exercise. Acknowledge that pre and post exercise glucose checks will be done for 3 sessions at entry of program.   Cardiac Rehab from 09/13/2017 in [facility] Cardiac and Pulmonary Rehab  Date  09/13/17  Educator  [facility]  Instruction Review Code  1- Verbalizes Understanding      Know Your Numbers and Risk Factors: -Group verbal and written instruction about important numbers in your health.  Discussion of what are risk factors and how they play a role in the disease process.  Review of Cholesterol, Blood Pressure, Diabetes, and BMI and the role they play in your overall health.   Sleep Hygiene: -Provides group verbal and written instruction about how sleep can affect your health.  Define sleep hygiene, discuss sleep cycles and impact of sleep habits. Review good sleep hygiene tips.    Other: -Provides group and verbal instruction on various topics (see comments)   Knowledge Questionnaire Score: Knowledge Questionnaire Score - 09/13/17 1446      Knowledge Questionnaire Score   Pre Score  19/26   correct answers reviewed with Kiyon, focus on Exercise and Nutrition      Core Components/Risk Factors/Patient Goals at Admission: Personal Goals and Risk Factors at Admission - 09/13/17 1446      Core Components/Risk Factors/Patient Goals on Admission    Weight Management  Yes;Obesity;Weight Loss    Intervention  Weight Management: Develop a combined nutrition and exercise program designed to reach desired caloric intake, while maintaining appropriate intake of nutrient and fiber, sodium and  fats, and appropriate energy expenditure required for the weight goal.;Weight Management: Provide education and appropriate resources to help participant work on and attain dietary goals.;Weight Management/Obesity: Establish reasonable short term and long term weight goals.;Obesity: Provide education and appropriate resources to help participant work on and attain dietary goals.    Admit Weight  312 lb (141.5 kg)    Goal Weight: Short Term  308 lb (139.7 kg)    Goal Weight: Long Term  270 lb (122.5 kg)    Expected Outcomes  Short Term: Continue to assess and modify interventions until short term weight is achieved;Long Term: Adherence to nutrition and physical activity/exercise program aimed toward attainment of established weight goal;Weight Loss: Understanding of general recommendations for a balanced deficit meal plan, which promotes 1-2 lb weight loss per week and includes a negative energy balance of [phone] kcal/d;Understanding recommendations for meals to include 15-35% energy as protein, 25-35% energy from fat, 35-60% energy from carbohydrates, less than '200mg'$  of dietary cholesterol, 20-35 gm of total fiber daily;Understanding of distribution of calorie intake throughout the day with the consumption of 4-5 meals/snacks    Diabetes  Yes    Intervention  Provide education about signs/symptoms and action to take for hypo/hyperglycemia.;Provide education about proper nutrition, including hydration, and aerobic/resistive exercise prescription along with prescribed medications to achieve blood glucose in normal ranges: Fasting glucose 65-99 mg/dL    Expected Outcomes  Short Term: Participant verbalizes understanding of the signs/symptoms and immediate care of hyper/hypoglycemia, proper foot care and importance of medication, aerobic/resistive exercise and nutrition plan for blood glucose control.;Long Term: Attainment of HbA1C < 7%.    Heart Failure  Yes    Intervention  Provide a combined exercise and  nutrition program that is supplemented with education, support and counseling about heart failure. Directed toward relieving symptoms such as shortness of breath, decreased exercise  tolerance, and extremity edema.    Expected Outcomes  Improve functional capacity of life;Short term: Attendance in program 2-3 days a week with increased exercise capacity. Reported lower sodium intake. Reported increased fruit and vegetable intake. Reports medication compliance.;Short term: Daily weights obtained and reported for increase. Utilizing diuretic protocols set by physician.;Long term: Adoption of self-care skills and reduction of barriers for early signs and symptoms recognition and intervention leading to self-care maintenance.    Hypertension  Yes    Intervention  Provide education on lifestyle modifcations including regular physical activity/exercise, weight management, moderate sodium restriction and increased consumption of fresh fruit, vegetables, and low fat dairy, alcohol moderation, and smoking cessation.;Monitor prescription use compliance.    Expected Outcomes  Short Term: Continued assessment and intervention until BP is < 140/16m HG in hypertensive participants. < 130/885mHG in hypertensive participants with diabetes, heart failure or chronic kidney disease.;Long Term: Maintenance of blood pressure at goal levels.    Lipids  Yes    Intervention  Provide education and support for participant on nutrition & aerobic/resistive exercise along with prescribed medications to achieve LDL <7056mHDL >65m29m  Expected Outcomes  Short Term: Participant states understanding of desired cholesterol values and is compliant with medications prescribed. Participant is following exercise prescription and nutrition guidelines.;Long Term: Cholesterol controlled with medications as prescribed, with individualized exercise RX and with personalized nutrition plan. Value goals: LDL < 70mg79mL > 40 mg.       Core  Components/Risk Factors/Patient Goals Review:    Core Components/Risk Factors/Patient Goals at Discharge (Final Review):    ITP Comments: ITP Comments    Row Name 09/13/17 1442 09/13/17 1453 09/19/17 0910 10/09/17 1554 10/17/17 0546   ITP Comments  Med Review completed. Initial ITP created. [facility]  Raun completed 2 min of the 6 min walk and needed to rest. While sitting, it was noted his heart rate was sustaining in 130s. SOB was relieved with rest and he thought it was because he usually gets SOB with movement. He rested in chair for 15 minutes and HR still maintained int he 130s. Patient brought upstairs to ED to be evaluated.   30 day review completed. ITP sent to Dr. Bert Ramonita Labering for Dr. Mark Emily Filbertical Director of Cardiac Rehab. Continue with ITP unless changes are made by physician.  new to program. Has not started exercise yet.   Dr. DixonDoren Custarded to let us [place] that he has a follow up appointment with [facility] next week.  Mr. DunstSummerjust discharged last week from a gout flare up and had some SVT while admitted then.  Dr. DixonDoren Custardonsidering a stress test prior to releasing him to start rehab.  He will let us [place] when Mr. DunstSteensonstart after his appointment next week.   30 day review completed. ITP sent to Dr. Mark Emily Filbertical Director of Cardiac Rehab. Continue with ITP unless changes are made by physician  HAs attended med review,out for medical concerns   [place] 10/24/17 1529 11/14/17 0652 12/05/17 0946 12/10/17 1726 12/12/17 0612   ITP Comments  Dr. DixonDoren Custarded to update us on[place]. DunstGloster was supposed to do a stress test yesterday, but present volume overloaded.  Dr. DixonDoren Custard some medication adjustements to try to optimize him.  He will have Mr. DunstJeanpaul back in 2 weeks for his stress test and will update us on[place]  status after that.   30 day review.  Continue with ITP unless directed changes per  Medical Director review.  Remians out for medical reasons  Received clearance for Joash to start rehab.  Left message on voice mail at home to call to schedule first day.    Patient came today with clearance from doctor to resume rehab. It has been 2 months since his initial walk test so 6 min walk test was repeated today. Patient also met with dietician and mental health counselor today.   30 day review. Continue with ITP unless direccted changes per Medical Director Chart Review.        Comments:

## 2017-12-12 NOTE — Progress Notes (Signed)
Cardiac Individual Treatment Plan  Patient Details  Name: Trevor Jennings MRN: [id number] Date of Birth: 07-26-1940 Referring Provider:     Cardiac Rehab from 09/13/2017 in [facility]  [facility]      Initial Encounter Date:    Cardiac Rehab from 09/13/2017 in [facility] Cardiac and Pulmonary Rehab  Date  09/13/17      Visit Diagnosis: Heart failure, chronic systolic ([place])  Patient's Home Medications on Admission:  Current Outpatient Medications:  .  albuterol (PROVENTIL HFA;VENTOLIN HFA) 108 (90 Base) MCG/ACT inhaler, Inhale into the lungs., Disp: , Rfl:  .  aspirin EC 81 MG tablet, Take by mouth., Disp: , Rfl:  .  atorvastatin (LIPITOR) 80 MG tablet, Take by mouth., Disp: , Rfl:  .  carvedilol (COREG) 25 MG tablet, Take by mouth., Disp: , Rfl:  .  Cholecalciferol (VITAMIN D-1000 MAX ST) 1000 units tablet, Take by mouth., Disp: , Rfl:  .  citalopram (CELEXA) 40 MG tablet, Take by mouth., Disp: , Rfl:  .  fluticasone (FLONASE) 50 MCG/ACT nasal spray, 2 sprays by Each Nare route daily., Disp: , Rfl:  .  gabapentin (NEURONTIN) 100 MG capsule, Take by mouth., Disp: , Rfl:  .  insulin glargine (LANTUS) 100 UNIT/ML injection, Inject into the skin., Disp: , Rfl:  .  insulin regular (NOVOLIN R,HUMULIN R) 100 units/mL injection, Inject into the skin., Disp: , Rfl:  .  loratadine (CLARITIN) 10 MG tablet, Take 10 mg by mouth daily., Disp: , Rfl:  .  pantoprazole (PROTONIX) 40 MG tablet, Take by mouth., Disp: , Rfl:  .  torsemide (DEMADEX) 20 MG tablet, Take by mouth., Disp: , Rfl:   Past Medical History: No past medical history on file.  Tobacco Use: Social History   Tobacco Use  Smoking Status Former Smoker  . Last attempt to quit: 1995  . Years since quitting: 24.8  Smokeless Tobacco Never Used    Labs: Recent Review Flowsheet Data    There is no flowsheet data to display.       Exercise Target Goals: Exercise Program Goal: Individual exercise  prescription set using results from initial 6 min walk test and THRR while considering  patient's activity barriers and safety.   Exercise Prescription Goal: Initial exercise prescription builds to 30-45 minutes a day of aerobic activity, 2-3 days per week.  Home exercise guidelines will be given to patient during program as part of exercise prescription that the participant will acknowledge.  Activity Barriers & Risk Stratification: Activity Barriers & Cardiac Risk Stratification - 09/13/17 1455      Activity Barriers & Cardiac Risk Stratification   Activity Barriers  Back Problems;Shortness of Breath    Cardiac Risk Stratification  High       6 Minute Walk: 6 Minute Walk    Row Name 09/13/17 1426 12/10/17 1646       6 Minute Walk   Phase  -  Initial    Distance  200 feet  380 feet    Walk Time  2 minutes  2.75 minutes    # of Rest Breaks  -  3    MPH  1.13  0.72    RPE  13  13    Perceived Dyspnea   2  -    Symptoms  Yes (comment)  Yes (comment)    Comments  short of breath - stopped at 2:00   shortness of breath and tiredness    Resting HR  88 bpm  64 bpm    Resting BP  110/64  110/70    Resting Oxygen Saturation   94 %  99 %    Exercise Oxygen Saturation  during 6 min walk  97 %  100 %    Max Ex. HR  144 bpm  77 bpm    Max Ex. BP  124/60  140/70    2 Minute Post BP  110/56  140/70       Oxygen Initial Assessment:   Oxygen Re-Evaluation:   Oxygen Discharge (Final Oxygen Re-Evaluation):   Initial Exercise Prescription: Initial Exercise Prescription - 09/13/17 1400      Date of Initial Exercise RX and Referring Provider   Date  09/13/17    [facility]      Treadmill   MPH  0.8    Grade  0    Minutes  15   rest as needed   METs  1      T5 Nustep   Level  1    SPM  60    Minutes  15      Biostep-RELP   Level  1    SPM  50    Minutes  15    METs  1      Track   Laps  10    Minutes  15   rest as needed   METs  1      Prescription  Details   Frequency (times per week)  3    Duration  Progress to 45 minutes of aerobic exercise without signs/symptoms of physical distress      Intensity   THRR 40-80% of Max Heartrate  110-132    Ratings of Perceived Exertion  11-13    Perceived Dyspnea  0-4      Resistance Training   Training Prescription  Yes    Weight  3 lb    Reps  10-15       Perform Capillary Blood Glucose checks as needed.  Exercise Prescription Changes: Exercise Prescription Changes    Row Name 09/13/17 1400 12/10/17 1600           Response to Exercise   Blood Pressure (Admit)  110/64  110/70      Blood Pressure (Exercise)  124/60  140/70      Blood Pressure (Exit)  110/56  104/70      Heart Rate (Admit)  81 bpm  64 bpm      Heart Rate (Exercise)  144 bpm  77 bpm      Heart Rate (Exit)  133 bpm  65 bpm      Oxygen Saturation (Admit)  96 %  99 %      Oxygen Saturation (Exercise)  98 %  100 %      Oxygen Saturation (Exit)  97 %  100 %      Rating of Perceived Exertion (Exercise)  13  13      Perceived Dyspnea (Exercise)  2  -        Resistance Training   Training Prescription  -  Yes      Weight  -  3 lb      Reps  -  10-15        Treadmill   MPH  -  0.8      Grade  -  0      Minutes  -  15 rest as needed  METs  -  1        T5 Nustep   Level  -  1      SPM  -  60      Minutes  -  15        Biostep-RELP   Level  -  1      SPM  -  50      Minutes  -  15      METs  -  1        Track   Laps  -  10      Minutes  -  15 rest as needed      METs  -  1         Exercise Comments:   Exercise Goals and Review: Exercise Goals    Row Name 09/13/17 1425             Exercise Goals   Increase Physical Activity  Yes       Intervention  Provide advice, education, support and counseling about physical activity/exercise needs.;Develop an individualized exercise prescription for aerobic and resistive training based on initial evaluation findings, risk stratification, comorbidities  and participant's personal goals.       Expected Outcomes  Short Term: Attend rehab on a regular basis to increase amount of physical activity.;Long Term: Add in home exercise to make exercise part of routine and to increase amount of physical activity.;Long Term: Exercising regularly at least 3-5 days a week.       Increase Strength and Stamina  Yes       Intervention  Provide advice, education, support and counseling about physical activity/exercise needs.;Develop an individualized exercise prescription for aerobic and resistive training based on initial evaluation findings, risk stratification, comorbidities and participant's personal goals.       Expected Outcomes  Short Term: Increase workloads from initial exercise prescription for resistance, speed, and METs.;Short Term: Perform resistance training exercises routinely during rehab and add in resistance training at home;Long Term: Improve cardiorespiratory fitness, muscular endurance and strength as measured by increased METs and functional capacity (6MWT)       Able to understand and use rate of perceived exertion (RPE) scale  Yes       Intervention  Provide education and explanation on how to use RPE scale       Expected Outcomes  Short Term: Able to use RPE daily in rehab to express subjective intensity level;Long Term:  Able to use RPE to guide intensity level when exercising independently       Able to understand and use Dyspnea scale  Yes       Intervention  Provide education and explanation on how to use Dyspnea scale       Expected Outcomes  Short Term: Able to use Dyspnea scale daily in rehab to express subjective sense of shortness of breath during exertion;Long Term: Able to use Dyspnea scale to guide intensity level when exercising independently       Knowledge and understanding of Target Heart Rate Range (THRR)  Yes       Intervention  Provide education and explanation of THRR including how the numbers were predicted and where they are  located for reference       Expected Outcomes  Short Term: Able to state/look up THRR;Long Term: Able to use THRR to govern intensity when exercising independently;Short Term: Able to use daily as guideline for intensity in rehab  Able to check pulse independently  Yes       Intervention  Provide education and demonstration on how to check pulse in carotid and radial arteries.;Review the importance of being able to check your own pulse for safety during independent exercise       Expected Outcomes  Short Term: Able to explain why pulse checking is important during independent exercise;Long Term: Able to check pulse independently and accurately       Understanding of Exercise Prescription  Yes       Intervention  Provide education, explanation, and written materials on patient's individual exercise prescription       Expected Outcomes  Short Term: Able to explain program exercise prescription;Long Term: Able to explain home exercise prescription to exercise independently          Exercise Goals Re-Evaluation :   Discharge Exercise Prescription (Final Exercise Prescription Changes): Exercise Prescription Changes - 12/10/17 1600      Response to Exercise   Blood Pressure (Admit)  110/70    Blood Pressure (Exercise)  140/70    Blood Pressure (Exit)  104/70    Heart Rate (Admit)  64 bpm    Heart Rate (Exercise)  77 bpm    Heart Rate (Exit)  65 bpm    Oxygen Saturation (Admit)  99 %    Oxygen Saturation (Exercise)  100 %    Oxygen Saturation (Exit)  100 %    Rating of Perceived Exertion (Exercise)  13      Resistance Training   Training Prescription  Yes    Weight  3 lb    Reps  10-15      Treadmill   MPH  0.8    Grade  0    Minutes  15   rest as needed   METs  1      T5 Nustep   Level  1    SPM  60    Minutes  15      Biostep-RELP   Level  1    SPM  50    Minutes  15    METs  1      Track   Laps  10    Minutes  15   rest as needed   METs  1       Nutrition:   Target Goals: Understanding of nutrition guidelines, daily intake of sodium <1552m, cholesterol <2010m calories 30% from fat and 7% or less from saturated fats, daily to have 5 or more servings of fruits and vegetables.  Biometrics: Pre Biometrics - 12/10/17 1651      Pre Biometrics   Height  5' 10"  (1.778 m)    Weight  (!) 310 lb 6.4 oz (140.8 kg)    Waist Circumference  55 inches    Hip Circumference  54 inches    Waist to Hip Ratio  1.02 %    BMI (Calculated)  44.54        Nutrition Therapy Plan and Nutrition Goals: Nutrition Therapy & Goals - 12/12/17 0941      Nutrition Therapy   Diet  DM    Drug/Food Interactions  Statins/Certain Fruits    Protein (specify units)  12oz    Fiber  35 grams    Whole Grain Foods  3 servings   chooses some whole grains   Saturated Fats  16 max. grams    Fruits and Vegetables  6 servings/day   8 ideal; typically eats 1 serving  of fruit per day   Sodium  1500 grams      Personal Nutrition Goals   Nutrition Goal  Add a protien source at breakfast and snacks more often to help better manage blood sugars.    Personal Goal #2  Eat less foods that contain added sugar IE doughnuts, cookies and candy. Instead, trial the new snack options you recently purchased such as yogurt, nuts and cheese    Personal Goal #3  Try not to skip meals during the day. If you are not hungry for lunch, at least add a protein source with your typical fruit    Comments  Pt reports his BG readings to be varying more than normal lately. Recent BG readings range from 165-190. Breakfast: oatmeal, cereal like Raisin Bran, eggs and 2 slices bacon, or doughnuts occasionally; Lunch: may skip and have a snack of fruit; Dinner: pork chops, hamburgers, pot pie, cabbage, chicken, broccoli, other vegetable varieties. They eat meals mostly at home and his wife does not cook with salt. He drinks water and coffee. They try to "watch" their starch intake in addition to salt. Per August 2019  labs sodium was below normal limits.       Intervention Plan   Intervention  Prescribe, educate and counsel regarding individualized specific dietary modifications aiming towards targeted core components such as weight, hypertension, lipid management, diabetes, heart failure and other comorbidities.    Expected Outcomes  Long Term Goal: Adherence to prescribed nutrition plan.;Short Term Goal: A plan Jennings been developed with personal nutrition goals set during dietitian appointment.;Short Term Goal: Understand basic principles of dietary content, such as calories, fat, sodium, cholesterol and nutrients.       Nutrition Assessments: Nutrition Assessments - 09/13/17 1445      MEDFICTS Scores   Pre Score  58       Nutrition Goals Re-Evaluation: Nutrition Goals Re-Evaluation    Row Name 12/12/17 1047             Goals   Nutrition Goal  Eat less foods that contain added sugar IE doughnuts, cookies and candy. Instead, trial the new snack options you recently purchased such as yogurt, nuts and cheese       Comment  He keeps candy on him on most days that he uses both as a snack and to treat hypoglycemia. He and his wife split half a dozen doughnuts on occasion for breakfast. Per wife, they recently purchased different snack options to try that are more nutrient-dense which they plan to trial this week       Expected Outcome  He will eat less foods that contain added sugars and replace these with foods that have natural sugars and/or that have more nutrient density         Personal Goal #2 Re-Evaluation   Personal Goal #2  Add a protein source at breakfast and snacks more often to better manage blood sugars         Personal Goal #3 Re-Evaluation   Personal Goal #3  Try not to skip meals during the day. If you are not hungry for lunch, at least add a protein source with your typical fruit          Nutrition Goals Discharge (Final Nutrition Goals Re-Evaluation): Nutrition Goals  Re-Evaluation - 12/12/17 1047      Goals   Nutrition Goal  Eat less foods that contain added sugar IE doughnuts, cookies and candy. Instead, trial the new snack options you recently  purchased such as yogurt, nuts and cheese    Comment  He keeps candy on him on most days that he uses both as a snack and to treat hypoglycemia. He and his wife split half a dozen doughnuts on occasion for breakfast. Per wife, they recently purchased different snack options to try that are more nutrient-dense which they plan to trial this week    Expected Outcome  He will eat less foods that contain added sugars and replace these with foods that have natural sugars and/or that have more nutrient density      Personal Goal #2 Re-Evaluation   Personal Goal #2  Add a protein source at breakfast and snacks more often to better manage blood sugars      Personal Goal #3 Re-Evaluation   Personal Goal #3  Try not to skip meals during the day. If you are not hungry for lunch, at least add a protein source with your typical fruit       Psychosocial: Target Goals: Acknowledge presence or absence of significant depression and/or stress, maximize coping skills, provide positive support system. Participant is able to verbalize types and ability to use techniques and skills needed for reducing stress and depression.   Initial Review & Psychosocial Screening: Initial Psych Review & Screening - 09/13/17 1443      Initial Review   Current issues with  Current Stress Concerns    Source of Stress Concerns  Chronic Illness;Unable to perform yard/household activities;Unable to participate in former interests or hobbies      [place]?  Yes   wife     Barriers   Psychosocial barriers to participate in program  There are no identifiable barriers or psychosocial needs.;The patient should benefit from training in stress management and relaxation.      Screening Interventions   Interventions  Encouraged  to exercise;Program counselor consult;To provide support and resources with identified psychosocial needs;Provide feedback about the scores to participant    Expected Outcomes  Short Term goal: Utilizing psychosocial counselor, staff and physician to assist with identification of specific Stressors or current issues interfering with healing process. Setting desired goal for each stressor or current issue identified.;Long Term Goal: Stressors or current issues are controlled or eliminated.;Short Term goal: Identification and review with participant of any Quality of Life or Depression concerns found by scoring the questionnaire.;Long Term goal: The participant improves quality of Life and PHQ9 Scores as seen by post scores and/or verbalization of changes       Quality of Life Scores:  Quality of Life - 09/13/17 1444      Quality of Life   Select  Quality of Life      Quality of Life Scores   Health/Function Pre  12.29 %    Socioeconomic Pre  19.83 %    Psych/Spiritual Pre  25.57 %    Family Pre  19.3 %    GLOBAL Pre  17.7 %      Scores of 19 and below usually indicate a poorer quality of life in these areas.  A difference of  2-3 points is a clinically meaningful difference.  A difference of 2-3 points in the total score of the Quality of Life Index Jennings been associated with significant improvement in overall quality of life, self-image, physical symptoms, and general health in studies assessing change in quality of life.  PHQ-9: Recent Review Flowsheet Data    Depression screen [facility] 2/9 12/10/2017 09/13/2017  Decreased Interest 0 2   Down, Depressed, Hopeless 2 2   PHQ - 2 Score 2 4   Altered sleeping 1 2   Tired, decreased energy 3 3   Change in appetite 1 0   Feeling bad or failure about yourself  2 0   Trouble concentrating 3 2   Moving slowly or fidgety/restless 3 0   Suicidal thoughts 0 0   PHQ-9 Score 15 11   Difficult doing work/chores Not difficult at all Somewhat difficult      Interpretation of Total Score  Total Score Depression Severity:  1-4 = Minimal depression, 5-9 = Mild depression, 10-14 = Moderate depression, 15-19 = Moderately severe depression, 20-27 = Severe depression   Psychosocial Evaluation and Intervention: Psychosocial Evaluation - 12/10/17 1739      Psychosocial Evaluation & Interventions   Interventions  Stress management education;Encouraged to exercise with the program and follow exercise prescription    Comments  Counselor met with Trevor Jennings [facility]) accompanied by his spouse - Trevor Jennings today for an initial psychsocial evaluation.  He is a 77 year old who Jennings COPD and Jennings been in and out of the hospital since August - due to falling;dehydration;and low potassium.  He also struggles with diabetes and HBP.  Trevor Jennings a strong support system with his spouse, his daughter in [place]; & spouse's extensive family locally.  He sleeps well with the use of a CPAP and Jennings a good appetite.  Trevor Jennings reports a history of depression and is on medication to help with this. He states he is typically in a good mood but his spouse reports this being up and down at times with irritability on occasion.  His goals for this program are to feel stronger; have more energy; be able to walk and be more flexible to tie his own shoes.  He would like to lose some weight also if possible.  Counselor informed Trevor Jennings of his PHQ-9 scores of 15 -indicating moderately severe symptoms of depression with feeling down or depressed and feeling bad about himself in his current health condition.  Trevor Jennings also reported his sister passed away  yesterday and this could impact his scores/mood today.  Counselor encouraged Trevor Jennings to attend and exercise consistently; to find ways to manage his stress and have support in his grief at this time.  Counselor will follow with him.     Expected Outcomes  Short:  Trevor Jennings will attend and exercise consistently for his health and mental health.  Trevor Jennings will  participate in the psychoeducational components of this program - including stress and depression to learn positive coping strategies.  Long:  Trevor Jennings will develop a routine of positive self-care for his health and mental health.    Continue Psychosocial Services   Follow up required by counselor       Psychosocial Re-Evaluation:   Psychosocial Discharge (Final Psychosocial Re-Evaluation):   Vocational Rehabilitation: Provide vocational rehab assistance to qualifying candidates.   Vocational Rehab Evaluation & Intervention: Vocational Rehab - 09/13/17 1446      Initial Vocational Rehab Evaluation & Intervention   Assessment shows need for Vocational Rehabilitation  No       Education: Education Goals: Education classes will be provided on a variety of topics geared toward better understanding of heart health and risk factor modification. Participant will state understanding/return demonstration of topics presented as noted by education test scores.  Learning Barriers/Preferences: Learning Barriers/Preferences - 09/13/17 1445      Learning Barriers/Preferences   Learning  Barriers  Hearing    Learning Preferences  Computer/Internet;Individual Instruction       Education Topics:  AED/CPR: - Group verbal and written instruction with the use of models to demonstrate the basic use of the AED with the basic ABC's of resuscitation.   General Nutrition Guidelines/Fats and Fiber: -Group instruction provided by verbal, written material, models and posters to present the general guidelines for heart healthy nutrition. Gives an explanation and review of dietary fats and fiber.   Controlling Sodium/Reading Food Labels: -Group verbal and written material supporting the discussion of sodium use in heart healthy nutrition. Review and explanation with models, verbal and written materials for utilization of the food label.   Cardiac Rehab from 12/12/2017 in [facility] Cardiac [facility] Pulmonary Rehab   Date  12/12/17  Educator  LB  Instruction Review Code  1- Verbalizes Understanding      Exercise Physiology & General Exercise Guidelines: - Group verbal and written instruction with models to review the exercise physiology of the cardiovascular system and associated critical values. Provides general exercise guidelines with specific guidelines to those with heart or lung disease.    Aerobic Exercise & Resistance Training: - Gives group verbal and written instruction on the various components of exercise. Focuses on aerobic and resistive training programs and the benefits of this training and how to safely progress through these programs..   Flexibility, Balance, Mind/Body Relaxation: Provides group verbal/written instruction on the benefits of flexibility and balance training, including mind/body exercise modes such as yoga, pilates and tai chi.  Demonstration and skill practice provided.   Stress and Anxiety: - Provides group verbal and written instruction about the health risks of elevated stress and causes of high stress.  Discuss the correlation between heart/lung disease and anxiety and treatment options. Review healthy ways to manage with stress and anxiety.   Depression: - Provides group verbal and written instruction on the correlation between heart/lung disease and depressed mood, treatment options, and the stigmas associated with seeking treatment.   Anatomy & Physiology of the Heart: - Group verbal and written instruction and models provide basic cardiac anatomy and physiology, with the coronary electrical and arterial systems. Review of Valvular disease and Heart Failure   Cardiac Procedures: - Group verbal and written instruction to review commonly prescribed medications for heart disease. Reviews the medication, class of the drug, and side effects. Includes the steps to properly store meds and maintain the prescription regimen. (beta blockers and nitrates)   Cardiac  Medications I: - Group verbal and written instruction to review commonly prescribed medications for heart disease. Reviews the medication, class of the drug, and side effects. Includes the steps to properly store meds and maintain the prescription regimen.   Cardiac Medications II: -Group verbal and written instruction to review commonly prescribed medications for heart disease. Reviews the medication, class of the drug, and side effects. (all other drug classes)    Go Sex-Intimacy & Heart Disease, Get SMART - Goal Setting: - Group verbal and written instruction through game format to discuss heart disease and the return to sexual intimacy. Provides group verbal and written material to discuss and apply goal setting through the application of the S.M.A.R.T. Method.   Other Matters of the Heart: - Provides group verbal, written materials and models to describe Stable Angina and Peripheral Artery. Includes description of the disease process and treatment options available to the cardiac patient.   Exercise & Equipment Safety: - Individual verbal instruction and demonstration of equipment use and  safety with use of the equipment.   Cardiac Rehab from 12/12/2017 in [facility] Cardiac and Pulmonary Rehab  Date  09/13/17  Educator  [facility]  Instruction Review Code  1- Verbalizes Understanding      Infection Prevention: - Provides verbal and written material to individual with discussion of infection control including proper hand washing and proper equipment cleaning during exercise session.   Cardiac Rehab from 12/12/2017 in [facility] Cardiac and Pulmonary Rehab  Date  09/13/17  Educator  [facility]  Instruction Review Code  1- Verbalizes Understanding      Falls Prevention: - Provides verbal and written material to individual with discussion of falls prevention and safety.   Cardiac Rehab from 12/12/2017 in [facility] Cardiac and Pulmonary Rehab  Date  09/13/17  Educator  [facility]  Instruction Review Code  1- Verbalizes  Understanding      Diabetes: - Individual verbal and written instruction to review signs/symptoms of diabetes, desired ranges of glucose level fasting, after meals and with exercise. Acknowledge that pre and post exercise glucose checks will be done for 3 sessions at entry of program.   Cardiac Rehab from 12/12/2017 in [facility] Cardiac and Pulmonary Rehab  Date  09/13/17  Educator  [facility]  Instruction Review Code  1- Verbalizes Understanding      Know Your Numbers and Risk Factors: -Group verbal and written instruction about important numbers in your health.  Discussion of what are risk factors and how they play a role in the disease process.  Review of Cholesterol, Blood Pressure, Diabetes, and BMI and the role they play in your overall health.   Sleep Hygiene: -Provides group verbal and written instruction about how sleep can affect your health.  Define sleep hygiene, discuss sleep cycles and impact of sleep habits. Review good sleep hygiene tips.    Other: -Provides group and verbal instruction on various topics (see comments)   Knowledge Questionnaire Score: Knowledge Questionnaire Score - 09/13/17 1446      Knowledge Questionnaire Score   Pre Score  19/26   correct answers reviewed with Trevor Jennings, focus on Exercise and Nutrition      Core Components/Risk Factors/Patient Goals at Admission: Personal Goals and Risk Factors at Admission - 09/13/17 1446      Core Components/Risk Factors/Patient Goals on Admission    Weight Management  Yes;Obesity;Weight Loss    Intervention  Weight Management: Develop a combined nutrition and exercise program designed to reach desired caloric intake, while maintaining appropriate intake of nutrient and fiber, sodium and fats, and appropriate energy expenditure required for the weight goal.;Weight Management: Provide education and appropriate resources to help participant work on and attain dietary goals.;Weight Management/Obesity: Establish reasonable  short term and long term weight goals.;Obesity: Provide education and appropriate resources to help participant work on and attain dietary goals.    Admit Weight  312 lb (141.5 kg)    Goal Weight: Short Term  308 lb (139.7 kg)    Goal Weight: Long Term  270 lb (122.5 kg)    Expected Outcomes  Short Term: Continue to assess and modify interventions until short term weight is achieved;Long Term: Adherence to nutrition and physical activity/exercise program aimed toward attainment of established weight goal;Weight Loss: Understanding of general recommendations for a balanced deficit meal plan, which promotes 1-2 lb weight loss per week and includes a negative energy balance of [phone] kcal/d;Understanding recommendations for meals to include 15-35% energy as protein, 25-35% energy from fat, 35-60% energy from carbohydrates, less than 231m of dietary cholesterol,  20-35 gm of total fiber daily;Understanding of distribution of calorie intake throughout the day with the consumption of 4-5 meals/snacks    Diabetes  Yes    Intervention  Provide education about signs/symptoms and action to take for hypo/hyperglycemia.;Provide education about proper nutrition, including hydration, and aerobic/resistive exercise prescription along with prescribed medications to achieve blood glucose in normal ranges: Fasting glucose 65-99 mg/dL    Expected Outcomes  Short Term: Participant verbalizes understanding of the signs/symptoms and immediate care of hyper/hypoglycemia, proper foot care and importance of medication, aerobic/resistive exercise and nutrition plan for blood glucose control.;Long Term: Attainment of HbA1C < 7%.    Heart Failure  Yes    Intervention  Provide a combined exercise and nutrition program that is supplemented with education, support and counseling about heart failure. Directed toward relieving symptoms such as shortness of breath, decreased exercise tolerance, and extremity edema.    Expected Outcomes   Improve functional capacity of life;Short term: Attendance in program 2-3 days a week with increased exercise capacity. Reported lower sodium intake. Reported increased fruit and vegetable intake. Reports medication compliance.;Short term: Daily weights obtained and reported for increase. Utilizing diuretic protocols set by physician.;Long term: Adoption of self-care skills and reduction of barriers for early signs and symptoms recognition and intervention leading to self-care maintenance.    Hypertension  Yes    Intervention  Provide education on lifestyle modifcations including regular physical activity/exercise, weight management, moderate sodium restriction and increased consumption of fresh fruit, vegetables, and low fat dairy, alcohol moderation, and smoking cessation.;Monitor prescription use compliance.    Expected Outcomes  Short Term: Continued assessment and intervention until BP is < 140/80m HG in hypertensive participants. < 130/838mHG in hypertensive participants with diabetes, heart failure or chronic kidney disease.;Long Term: Maintenance of blood pressure at goal levels.    Lipids  Yes    Intervention  Provide education and support for participant on nutrition & aerobic/resistive exercise along with prescribed medications to achieve LDL <7066mHDL >5m22m  Expected Outcomes  Short Term: Participant states understanding of desired cholesterol values and is compliant with medications prescribed. Participant is following exercise prescription and nutrition guidelines.;Long Term: Cholesterol controlled with medications as prescribed, with individualized exercise RX and with personalized nutrition plan. Value goals: LDL < 70mg64mL > 40 mg.       Core Components/Risk Factors/Patient Goals Review:    Core Components/Risk Factors/Patient Goals at Discharge (Final Review):    ITP Comments: ITP Comments    Row Name 09/13/17 1442 09/13/17 1453 09/19/17 0910 10/09/17 1554 10/17/17 0546    ITP Comments  Med Review completed. Initial ITP created. [facility]  Bricyn completed 2 min of the 6 min walk and needed to rest. While sitting, it was noted his heart rate was sustaining in 130s. SOB was relieved with rest and he thought it was because he usually gets SOB with movement. He rested in chair for 15 minutes and HR still maintained int he 130s. Patient brought upstairs to ED to be evaluated.   30 day review completed. ITP sent to Dr. Bert Ramonita Labering for Dr. Mark Emily Filbertical Director of Cardiac Rehab. Continue with ITP unless changes are made by physician.  new to program. Jennings not started exercise yet.   Dr. DixonDoren Custarded to let us kn[place] that he Jennings a follow up appointment with Eliazar next week.  Mr. DunstWeidajust discharged last week from a gout  flare up and had some SVT while admitted then.  Dr. Doren Custard is considering a stress test prior to releasing him to start rehab.  He will let us know when Mr. Perea can start after his appointment next week.   30 day review completed. ITP sent to Dr. Emily Filbert, Medical Director of Cardiac Rehab. Continue with ITP unless changes are made by physician  Jennings attended med review,out for medical concerns   [place] Name 10/24/17 1529 11/14/17 0652 12/05/17 0946 12/10/17 1726 12/12/17 0612   ITP Comments  Dr. Doren Custard called to update [place] on Mr. Parcel.  He was supposed to do a stress test yesterday, but present volume overloaded.  Dr. Doren Custard made some medication adjustements to try to optimize him.  He will have Mr. Degen come back in 2 weeks for his stress test and will update [place] on his status after that.   30 day review.  Continue with ITP unless directed changes per Medical Director review.  Remians out for medical reasons  Received clearance for Keymani to start rehab.  Left message on voice mail at home to call to schedule first day.    Patient came today with clearance from doctor to resume rehab. It Jennings been  2 months since his initial walk test so 6 min walk test was repeated today. Patient also met with dietician and mental health counselor today.   30 day review. Continue with ITP unless direccted changes per Medical Director Chart Review.        Comments: : First full day of exercise!  Patient was oriented to gym and equipment including functions, settings, policies, and procedures.  Patient's individual exercise prescription and treatment plan were reviewed.  All starting workloads were established based on the results of the 6 minute walk test done at initial orientation visit.  The plan for exercise progression was also introduced and progression will be customized based on patient's performance and goals.

## 2017-12-12 NOTE — Progress Notes (Signed)
Daily Session Note  Patient Details  Name: Cregg Jutte MRN: [id number] Date of Birth: 03-29-1940 Referring Provider:     Cardiac Rehab from 09/13/2017 in [facility] Cardiac [facility] Pulmonary Re[facility]  [facility]      Encounter Date: 12/12/2017  Check In: Session Check In - 12/12/17 1633      Check-In   Supervising physician immediately available to respond to emergencies  See telemetry face sheet for immediately available ER MD    Location  [facility]-Cardiac & Pulmonary Rehab    Staff Present  Renita Papa, RN BSN;Biran Mayberry, RN, BSN    Medication changes reported      No    Fall or balance concerns reported     No    Tobacco Cessation  No Change    Warm-up and Cool-down  Performed on first and last piece of equipment    Resistance Training Performed  Yes    VAD Patient?  No    PAD/SET Patient?  No      Pain Assessment   Currently in Pain?  No/denies          Social History   Tobacco Use  Smoking Status Former Smoker  . Last attempt to quit: 1995  . Years since quitting: 24.8  Smokeless Tobacco Never Used    Goals Met:  Proper associated with RPD/PD & O2 Sat No report of cardiac concerns or symptoms Strength training completed today  Goals Unmet:  Not Applicable  Comments: First full day of exercise!  Patient was oriented to gym and equipment including functions, settings, policies, and procedures.  Patient's individual exercise prescription and treatment plan were reviewed.  All starting workloads were established based on the results of the 6 minute walk test done at initial orientation visit.  The plan for exercise progression was also introduced and progression will be customized based on patient's performance and goals.    Dr. Emily Filbert is Medical Director for [place] and [facility].

## 2017-12-13 DIAGNOSIS — I5022 Chronic systolic (congestive) heart failure: Secondary | ICD-10-CM

## 2017-12-13 LAB — GLUCOSE, CAPILLARY: GLUCOSE-CAPILLARY: 129 mg/dL — AB (ref 70–99)

## 2017-12-13 NOTE — Progress Notes (Signed)
Daily Session Note  Patient Details  Name: Trevor Jennings MRN: [id number] Date of Birth: [birth year] Referring Provider:     Cardiac Rehab from 09/13/2017 in [facility] Cardiac and Pulmonary Rehab  [facility]      Encounter Date: 12/13/2017  Check In: Session Check In - 12/13/17 1612      Check-In   Supervising physician immediately available to respond to emergencies  See telemetry face sheet for immediately available ER MD    Location  [facility]-Cardiac & Pulmonary Rehab    Staff Present  Renita Papa, RN BSN;Lorrayne Ismael [address] [place], [place], ACSM CEP, Exercise Physiologist    Medication changes reported      No    Fall or balance concerns reported     No    Warm-up and Cool-down  Performed on first and last piece of equipment    Resistance Training Performed  Yes    VAD Patient?  No    PAD/SET Patient?  No      Pain Assessment   Currently in Pain?  No/denies          Social History   Tobacco Use  Smoking Status Former Smoker  . Last attempt to quit: 1995  . Years since quitting: 24.8  Smokeless Tobacco Never Used    Goals Met:  Independence with exercise equipment Exercise tolerated well No report of cardiac concerns or symptoms Strength training completed today  Goals Unmet:  Not Applicable  Comments: Pt able to follow exercise prescription today without complaint.  Will continue to monitor for progression.    Dr. Emily Filbert is Medical Director for [place] and [facility].

## 2017-12-17 ENCOUNTER — Encounter: Payer: No Typology Code available for payment source | Admitting: *Deleted

## 2017-12-17 DIAGNOSIS — I5022 Chronic systolic (congestive) heart failure: Secondary | ICD-10-CM

## 2017-12-17 LAB — GLUCOSE, CAPILLARY: Glucose-Capillary: 130 mg/dL — ABNORMAL HIGH (ref 70–99)

## 2017-12-17 NOTE — Progress Notes (Signed)
Daily Session Note  Patient Details  Name: Norah Fick MRN: [id number] Date of Birth: 07/14/1940 Referring Provider:     Cardiac Rehab from 09/13/2017 in [facility] Cardiac and Pulmonary Rehab  [facility]      Encounter Date: 12/17/2017  Check In: Session Check In - 12/17/17 1701      Check-In   Supervising physician immediately available to respond to emergencies  See telemetry face sheet for immediately available ER MD    Location  [facility]-Cardiac & Pulmonary Rehab    Staff Present  Earlean Shawl, BS, ACSM CEP, Exercise Physiologist;Mary Kellie Shropshire, RN, BSN, [place], BS, RRT, Respiratory Therapist    Medication changes reported      No    Fall or balance concerns reported     No    Tobacco Cessation  No Change    Warm-up and Cool-down  Performed on first and last piece of equipment    Resistance Training Performed  Yes    VAD Patient?  No    PAD/SET Patient?  No      Pain Assessment   Currently in Pain?  No/denies    Multiple Pain Sites  No          Social History   Tobacco Use  Smoking Status Former Smoker  . Last attempt to quit: 1995  . Years since quitting: 24.8  Smokeless Tobacco Never Used    Goals Met:  Independence with exercise equipment Exercise tolerated well No report of cardiac concerns or symptoms Strength training completed today  Goals Unmet:  Not Applicable  Comments: Pt able to follow exercise prescription today without complaint.  Will continue to monitor for progression.    Dr. Emily Filbert is Medical Director for [place] and [facility].

## 2017-12-19 ENCOUNTER — Encounter: Payer: No Typology Code available for payment source | Admitting: *Deleted

## 2017-12-19 DIAGNOSIS — E785 Hyperlipidemia, unspecified: Secondary | ICD-10-CM

## 2017-12-19 DIAGNOSIS — I5022 Chronic systolic (congestive) heart failure: Secondary | ICD-10-CM

## 2017-12-19 NOTE — Progress Notes (Signed)
Daily Session Note  Patient Details  Name: Trevor Jennings MRN: [id number] Date of Birth: [birth year] Referring Provider:     Cardiac Rehab from 09/13/2017 in [facility] Cardiac and Pulmonary Rehab  [facility]      Encounter Date: 12/19/2017  Check In: Session Check In - 12/19/17 1558      Check-In   Supervising physician immediately available to respond to emergencies  See telemetry face sheet for immediately available ER MD    Location  [facility]-Cardiac & Pulmonary Rehab    Staff Present  Nada Maclachlan, BA, ACSM CEP, Exercise Physiologist;Carroll Enterkin, RN, BSN;Meredith Sherryll Burger, RN BSN          Social History   Tobacco Use  Smoking Status Former Smoker  . Last attempt to quit: 1995  . Years since quitting: 24.8  Smokeless Tobacco Never Used    Goals Met:  Independence with exercise equipment Exercise tolerated well No report of cardiac concerns or symptoms Strength training completed today  Goals Unmet:  Not Applicable  Comments: Pt able to follow exercise prescription today without complaint.  Will continue to monitor for progression.    Dr. Emily Filbert is Medical Director for [place] and [facility].

## 2017-12-19 NOTE — Progress Notes (Signed)
Daily Session Note  Patient Details  Name: Trevor Jennings MRN: [id number] Date of Birth: 1940-04-25 Referring Provider:     Cardiac Rehab from 09/13/2017 in [facility] Cardiac and Pulmonary Rehab  [facility]      Encounter Date: 12/19/2017  Check In: Session Check In - 12/19/17 1621      Check-In   Supervising physician immediately available to respond to emergencies  See telemetry face sheet for immediately available MD    Location  [facility]-Cardiac & Pulmonary Rehab    Staff Present  Gerlene Burdock, RN, BSN;Meredith Sherryll Burger, RN Vickki Hearing, BA, ACSM CEP, Exercise Physiologist    Medication changes reported      No    Fall or balance concerns reported     No    Tobacco Cessation  No Change    Warm-up and Cool-down  Performed on first and last piece of equipment    Resistance Training Performed  Yes    VAD Patient?  No    PAD/SET Patient?  No      Pain Assessment   Currently in Pain?  No/denies          Social History   Tobacco Use  Smoking Status Former Smoker  . Last attempt to quit: 1995  . Years since quitting: 24.8  Smokeless Tobacco Never Used    Goals Met:  Proper associated with RPD/PD & O2 Sat Exercise tolerated well No report of cardiac concerns or symptoms Strength training completed today  Goals Unmet:  Not Applicable  Comments:     Dr. Emily Filbert is Medical Director for [place] and [facility].

## 2017-12-26 ENCOUNTER — Encounter: Payer: No Typology Code available for payment source | Admitting: *Deleted

## 2017-12-26 DIAGNOSIS — I5022 Chronic systolic (congestive) heart failure: Secondary | ICD-10-CM | POA: Diagnosis not present

## 2017-12-26 DIAGNOSIS — G4733 Obstructive sleep apnea (adult) (pediatric): Secondary | ICD-10-CM

## 2017-12-26 NOTE — Progress Notes (Signed)
Daily Session Note  Patient Details  Name: Trevor Jennings MRN: [id number] Date of Birth: 1940-03-17 Referring Provider:     Cardiac Rehab from 09/13/2017 in [facility] Cardiac and Pulmonary Rehab  [facility]      Encounter Date: 12/26/2017  Check In: Session Check In - 12/26/17 1648      Check-In   Supervising physician immediately available to respond to emergencies  See telemetry face sheet for immediately available ER MD    Location  [facility]-Cardiac & Pulmonary Rehab    Staff Present  Gerlene Burdock, RN, Vickki Hearing, BA, ACSM CEP, Exercise Physiologist;Meredith Sherryll Burger, RN BSN    Medication changes reported      No    Fall or balance concerns reported     No    Tobacco Cessation  No Change    Warm-up and Cool-down  Performed on first and last piece of equipment    Resistance Training Performed  Yes    VAD Patient?  No    PAD/SET Patient?  No      Pain Assessment   Currently in Pain?  No/denies          Social History   Tobacco Use  Smoking Status Former Smoker  . Last attempt to quit: 1995  . Years since quitting: 24.9  Smokeless Tobacco Never Used    Goals Met:  Proper associated with RPD/PD & O2 Sat Independence with exercise equipment No report of cardiac concerns or symptoms Strength training completed today  Goals Unmet:  Not Applicable  Comments:     Dr. Emily Filbert is Medical Director for [place] and [facility].

## 2017-12-27 DIAGNOSIS — I5022 Chronic systolic (congestive) heart failure: Secondary | ICD-10-CM

## 2017-12-27 NOTE — Progress Notes (Signed)
Daily Session Note  Patient Details  Name: Trevor Jennings MRN: [id number] Date of Birth: 1940-06-30 Referring Provider:     Cardiac Rehab from 09/13/2017 in [facility] Cardiac and Pulmonary Rehab  [facility]      Encounter Date: 12/27/2017  Check In: Session Check In - 12/27/17 1613      Check-In   Supervising physician immediately available to respond to emergencies  See telemetry face sheet for immediately available ER MD    Location  [facility]-Cardiac & Pulmonary Rehab    Staff Present  Earlean Shawl, BS, ACSM CEP, Exercise Physiologist;Jessica Luan Pulling, MA, RCEP, CCRP, Exercise Physiologist;Meredith Sherryll Burger, RN BSN    Medication changes reported      No    Fall or balance concerns reported     No    Warm-up and Cool-down  Performed on first and last piece of equipment    Resistance Training Performed  Yes    VAD Patient?  No    PAD/SET Patient?  No      Pain Assessment   Currently in Pain?  No/denies          Social History   Tobacco Use  Smoking Status Former Smoker  . Last attempt to quit: 1995  . Years since quitting: 24.9  Smokeless Tobacco Never Used    Goals Met:  Independence with exercise equipment Exercise tolerated well No report of cardiac concerns or symptoms Strength training completed today  Goals Unmet:  Not Applicable  Comments: Pt able to follow exercise prescription today without complaint.  Will continue to monitor for progression.  Reviewed home exercise with pt today.  Pt plans to use exercise chair with DVD's and walk at home for exercise.  Reviewed THR, pulse, RPE, sign and symptoms, NTG use, and when to call 911 or MD.  Also discussed weather considerations and indoor options.  Pt voiced understanding.    Dr. Emily Filbert is Medical Director for [place] and [facility].

## 2017-12-31 ENCOUNTER — Encounter: Payer: No Typology Code available for payment source | Admitting: *Deleted

## 2017-12-31 DIAGNOSIS — I5022 Chronic systolic (congestive) heart failure: Secondary | ICD-10-CM | POA: Diagnosis not present

## 2017-12-31 NOTE — Progress Notes (Signed)
Daily Session Note  Patient Details  Name: Trevor Jennings MRN: [id number] Date of Birth: July 09, 1940 Referring Provider:     Cardiac Rehab from 09/13/2017 in [facility] Cardiac and Pulmonary Rehab  [facility]      Encounter Date: 12/31/2017  Check In: Session Check In - 12/31/17 1708      Check-In   Supervising physician immediately available to respond to emergencies  See telemetry face sheet for immediately available ER MD    Location  [facility]-Cardiac & Pulmonary Rehab    Staff Present  Earlean Shawl, BS, ACSM CEP, Exercise Physiologist;Carroll Enterkin, RN, Vickki Hearing, BA, ACSM CEP, Exercise Physiologist    Medication changes reported      No    Fall or balance concerns reported     No    Tobacco Cessation  No Change    Warm-up and Cool-down  Performed as group-led instruction    Resistance Training Performed  Yes    VAD Patient?  No    PAD/SET Patient?  No      Pain Assessment   Currently in Pain?  No/denies    Multiple Pain Sites  No          Social History   Tobacco Use  Smoking Status Former Smoker  . Last attempt to quit: 1995  . Years since quitting: 24.9  Smokeless Tobacco Never Used    Goals Met:  Independence with exercise equipment Exercise tolerated well No report of cardiac concerns or symptoms Strength training completed today  Goals Unmet:  Not Applicable  Comments: Pt able to follow exercise prescription today without complaint.  Will continue to monitor for progression.    Dr. Emily Filbert is Medical Director for [place] and [facility].

## 2018-01-02 ENCOUNTER — Encounter: Payer: No Typology Code available for payment source | Admitting: *Deleted

## 2018-01-02 DIAGNOSIS — I5022 Chronic systolic (congestive) heart failure: Secondary | ICD-10-CM | POA: Diagnosis not present

## 2018-01-02 NOTE — Progress Notes (Signed)
Daily Session Note  Patient Details  Name: Trevor Jennings MRN: [id number] Date of Birth: 02/17/1940 Referring Provider:     Cardiac Rehab from 09/13/2017 in [facility] Cardiac and Pulmonary Rehab  [facility]      Encounter Date: 01/02/2018  Check In: Session Check In - 01/02/18 1641      Check-In   Supervising physician immediately available to respond to emergencies  See telemetry face sheet for immediately available ER MD    Location  [facility]-Cardiac & Pulmonary Rehab    Staff Present  Renita Papa, RN BSN;Carroll Enterkin, RN, Vickki Hearing, BA, ACSM CEP, Exercise Physiologist    Medication changes reported      No    Fall or balance concerns reported     No    Warm-up and Cool-down  Performed as group-led instruction    Resistance Training Performed  Yes    VAD Patient?  No    PAD/SET Patient?  No      Pain Assessment   Currently in Pain?  No/denies          Social History   Tobacco Use  Smoking Status Former Smoker  . Last attempt to quit: 1995  . Years since quitting: 24.9  Smokeless Tobacco Never Used    Goals Met:  Independence with exercise equipment Exercise tolerated well No report of cardiac concerns or symptoms Strength training completed today  Goals Unmet:  Not Applicable  Comments: Pt able to follow exercise prescription today without complaint.  Will continue to monitor for progression.    Dr. Emily Filbert is Medical Director for [place] and [facility].

## 2018-01-07 ENCOUNTER — Encounter: Payer: No Typology Code available for payment source | Attending: Internal Medicine

## 2018-01-07 DIAGNOSIS — I5022 Chronic systolic (congestive) heart failure: Secondary | ICD-10-CM | POA: Diagnosis present

## 2018-01-07 DIAGNOSIS — G4733 Obstructive sleep apnea (adult) (pediatric): Secondary | ICD-10-CM

## 2018-01-07 DIAGNOSIS — E785 Hyperlipidemia, unspecified: Secondary | ICD-10-CM

## 2018-01-07 DIAGNOSIS — Z87891 Personal history of nicotine dependence: Secondary | ICD-10-CM | POA: Insufficient documentation

## 2018-01-07 NOTE — Progress Notes (Signed)
Daily Session Note  Patient Details  Name: Trevor Jennings MRN: [id number] Date of Birth: 07/13/1940 Referring Provider:     Cardiac Rehab from 09/13/2017 in [facility]  [facility]      Encounter Date: 01/07/2018  Check In: Session Check In - 01/07/18 1728      Check-In   Supervising physician immediately available to respond to emergencies  See telemetry face sheet for immediately available ER MD    Location  [facility]-Cardiac & Pulmonary Rehab    Staff Present  Earlean Shawl, BS, ACSM CEP, Exercise Physiologist;Carroll Enterkin, RN, Vickki Hearing, BA, ACSM CEP, Exercise Physiologist    Medication changes reported      No    Fall or balance concerns reported     No    Warm-up and Cool-down  Performed as group-led instruction    Resistance Training Performed  Yes    VAD Patient?  No    PAD/SET Patient?  No      Pain Assessment   Currently in Pain?  No/denies    Multiple Pain Sites  No          Social History   Tobacco Use  Smoking Status Former Smoker  . Last attempt to quit: 1995  . Years since quitting: 24.9  Smokeless Tobacco Never Used    Goals Met:  Independence with exercise equipment Exercise tolerated well No report of cardiac concerns or symptoms Strength training completed today  Goals Unmet:  Not Applicable  Comments: Pt able to follow exercise prescription today without complaint.  Will continue to monitor for progression.    Dr. Emily Filbert is Medical Director for [place] [facility].

## 2018-01-09 ENCOUNTER — Encounter: Payer: Self-pay | Admitting: *Deleted

## 2018-01-09 DIAGNOSIS — I5022 Chronic systolic (congestive) heart failure: Secondary | ICD-10-CM

## 2018-01-09 DIAGNOSIS — G4733 Obstructive sleep apnea (adult) (pediatric): Secondary | ICD-10-CM

## 2018-01-09 NOTE — Progress Notes (Signed)
Daily Session Note  Patient Details  Name: Trevor Jennings MRN: [id number] Date of Birth: 01/15/1941 Referring Provider:     Cardiac Rehab from 09/13/2017 in [facility]  [facility]      Encounter Date: 01/09/2018  Check In: Session Check In - 01/09/18 1731      Check-In   Supervising physician immediately available to respond to emergencies  See telemetry face sheet for immediately available ER MD    Location  [facility]-Card[facility] Pul[facility]    Staff Present  Renita Papa, RN BSN;Susanne Bice, RN, BSN, CCRP;Amanda Sommer, BA, ACSM CEP, Exercise Physiologist    Medication changes reported      No    Fall or balance concerns reported     No    Warm-up and Cool-down  Performed as group-led instruction    Resistance Training Performed  Yes    VAD Patient?  No    PAD/SET Patient?  No      Pain Assessment   Currently in Pain?  No/denies    Multiple Pain Sites  No          Social History   Tobacco Use  Smoking Status Former Smoker  . Last attempt to quit: 1995  . Years since quitting: 24.9  Smokeless Tobacco Never Used    Goals Met:  Independence with exercise equipment Exercise tolerated well No report of cardiac concerns or symptoms Strength training completed today  Goals Unmet:  Not Applicable  Comments: Pt able to follow exercise prescription today without complaint.  Will continue to monitor for progression.    Dr. Emily Filbert is Medical Director for [place] [facility].

## 2018-01-09 NOTE — Progress Notes (Signed)
Cardiac Individual Treatment Plan  Patient Details  Name: Trevor Jennings MRN: [id number] Date of Birth: 10/17/1940 Referring Provider:     Cardiac Rehab from 09/13/2017 in [facility]  [facility]      Initial Encounter Date:    Cardiac Rehab from 09/13/2017 in [facility] Cardiac and Pulmonary Rehab  Date  09/13/17      Visit Diagnosis: Heart failure, chronic systolic ([place])  Patient's Home Medications on Admission:  Current Outpatient Medications:  .  albuterol (PROVENTIL HFA;VENTOLIN HFA) 108 (90 Base) MCG/ACT inhaler, Inhale into the lungs., Disp: , Rfl:  .  aspirin EC 81 MG tablet, Take by mouth., Disp: , Rfl:  .  atorvastatin (LIPITOR) 80 MG tablet, Take by mouth., Disp: , Rfl:  .  carvedilol (COREG) 25 MG tablet, Take by mouth., Disp: , Rfl:  .  Cholecalciferol (VITAMIN D-1000 MAX ST) 1000 units tablet, Take by mouth., Disp: , Rfl:  .  citalopram (CELEXA) 40 MG tablet, Take by mouth., Disp: , Rfl:  .  fluticasone (FLONASE) 50 MCG/ACT nasal spray, 2 sprays by Each Nare route daily., Disp: , Rfl:  .  gabapentin (NEURONTIN) 100 MG capsule, Take by mouth., Disp: , Rfl:  .  insulin glargine (LANTUS) 100 UNIT/ML injection, Inject into the skin., Disp: , Rfl:  .  insulin regular (NOVOLIN R,HUMULIN R) 100 units/mL injection, Inject into the skin., Disp: , Rfl:  .  loratadine (CLARITIN) 10 MG tablet, Take 10 mg by mouth daily., Disp: , Rfl:  .  pantoprazole (PROTONIX) 40 MG tablet, Take by mouth., Disp: , Rfl:  .  torsemide (DEMADEX) 20 MG tablet, Take by mouth., Disp: , Rfl:   Past Medical History: No past medical history on file.  Tobacco Use: Social History   Tobacco Use  Smoking Status Former Smoker  . Last attempt to quit: 1995  . Years since quitting: 24.9  Smokeless Tobacco Never Used    Labs: Recent Review Flowsheet Data    There is no flowsheet data to display.       Exercise Target Goals: Exercise Program Goal: Individual exercise  prescription set using results from initial 6 min walk test and THRR while considering  patient's activity barriers and safety.   Exercise Prescription Goal: Initial exercise prescription builds to 30-45 minutes a day of aerobic activity, 2-3 days per week.  Home exercise guidelines will be given to patient during program as part of exercise prescription that the participant will acknowledge.  Activity Barriers & Risk Stratification: Activity Barriers & Cardiac Risk Stratification - 09/13/17 1455      Activity Barriers & Cardiac Risk Stratification   Activity Barriers  Back Problems;Shortness of Breath    Cardiac Risk Stratification  High       6 Minute Walk: 6 Minute Walk    Row Name 09/13/17 1426 12/10/17 1646       6 Minute Walk   Phase  -  Initial    Distance  200 feet  380 feet    Walk Time  2 minutes  2.75 minutes    # of Rest Breaks  -  3    MPH  1.13  0.72    RPE  13  13    Perceived Dyspnea   2  -    Symptoms  Yes (comment)  Yes (comment)    Comments  short of breath - stopped at 2:00   shortness of breath and tiredness    Resting HR  88 bpm  64 bpm    Resting BP  110/64  110/70    Resting Oxygen Saturation   94 %  99 %    Exercise Oxygen Saturation  during 6 min walk  97 %  100 %    Max Ex. HR  144 bpm  77 bpm    Max Ex. BP  124/60  140/70    2 Minute Post BP  110/56  140/70       Oxygen Initial Assessment:   Oxygen Re-Evaluation:   Oxygen Discharge (Final Oxygen Re-Evaluation):   Initial Exercise Prescription: Initial Exercise Prescription - 09/13/17 1400      Date of Initial Exercise RX and Referring Provider   Date  09/13/17    [facility] Provider  [facility]      Treadmill   MPH  0.8    Grade  0    Minutes  15   rest as needed   METs  1      T5 Nustep   Level  1    SPM  60    Minutes  15      Biostep-RELP   Level  1    SPM  50    Minutes  15    METs  1      Track   Laps  10    Minutes  15   rest as needed   METs  1      Prescription  Details   Frequency (times per week)  3    Duration  Progress to 45 minutes of aerobic exercise without signs/symptoms of physical distress      Intensity   THRR 40-80% of Max Heartrate  110-132    Ratings of Perceived Exertion  11-13    Perceived Dyspnea  0-4      Resistance Training   Training Prescription  Yes    Weight  3 lb    Reps  10-15       Perform Capillary Blood Glucose checks as needed.  Exercise Prescription Changes: Exercise Prescription Changes    Row Name 09/13/17 1400 12/10/17 1600 12/19/17 1100 12/27/17 1600 01/01/18 0800     Response to Exercise   Blood Pressure (Admit)  110/64  110/70  120/66  -  138/64   Blood Pressure (Exercise)  124/60  140/70  136/60  -  126/66   Blood Pressure (Exit)  110/56  104/70  130/82  -  -   Heart Rate (Admit)  81 bpm  64 bpm  95 bpm  -  70 bpm   Heart Rate (Exercise)  144 bpm  77 bpm  117 bpm  -  103 bpm   Heart Rate (Exit)  133 bpm  65 bpm  67 bpm  -  73 bpm   Oxygen Saturation (Admit)  96 %  99 %  -  -  -   Oxygen Saturation (Exercise)  98 %  100 %  -  -  -   Oxygen Saturation (Exit)  97 %  100 %  -  -  -   Rating of Perceived Exertion (Exercise)  13  13  15   -  11   Perceived Dyspnea (Exercise)  2  -  -  -  -   Symptoms  -  -  -  -  did not feel PVCs   Duration  -  -  Progress to 45 minutes of aerobic exercise without signs/symptoms of physical distress  Progress to 45 minutes of aerobic exercise without signs/symptoms of physical distress  Progress to 45 minutes of aerobic exercise without signs/symptoms of physical distress   Intensity  -  -  THRR unchanged  THRR unchanged  Other (comment) pacemaker     Progression   Progression  -  -  Continue to progress workloads to maintain intensity without signs/symptoms of physical distress.  Continue to progress workloads to maintain intensity without signs/symptoms of physical distress.  Continue to progress workloads to maintain intensity without signs/symptoms of physical  distress.   Average METs  -  -  2.1  2.1  2     Resistance Training   Training Prescription  -  Yes  Yes  Yes  Yes   Weight  -  3 lb  3 lb  3 lb  3 lb   Reps  -  10-15  10-15  10-15  10-15     Interval Training   Interval Training  -  -  No  No  No     Treadmill   MPH  -  0.8  -  -  -   Grade  -  0  -  -  -   Minutes  -  15 rest as needed  -  -  -   METs  -  1  -  -  -     T5 Nustep   Level  -  1  1  1  1    SPM  -  60  60  60  60   Minutes  -  15  15  15  15      Biostep-RELP   Level  -  1  1  1   -   SPM  -  50  50  50  -   Minutes  -  15  15  15   -   METs  -  1  2  2   -     Track   Laps  -  10  -  -  12   Minutes  -  15 rest as needed  -  -  -   METs  -  1  -  -  1.55     Home Exercise Plan   Plans to continue exercise at  -  -  -  Home (comment) exercise chair with DVD's at home, and walking  Home (comment) exercise chair with DVD's at home, and walking   Frequency  -  -  -  Add 2 additional days to program exercise sessions.  Add 2 additional days to program exercise sessions.   Initial Home Exercises Provided  -  -  -  12/27/17  12/27/17      Exercise Comments:   Exercise Goals and Review: Exercise Goals    Row Name 09/13/17 1425             Exercise Goals   Increase Physical Activity  Yes       Intervention  Provide advice, education, support and counseling about physical activity/exercise needs.;Develop an individualized exercise prescription for aerobic and resistive training based on initial evaluation findings, risk stratification, comorbidities and participant's personal goals.       Expected Outcomes  Short Term: Attend rehab on a regular basis to increase amount of physical activity.;Long Term: Add in home exercise to make exercise part of routine and to increase amount of physical activity.;Long Term: Exercising regularly at least  3-5 days a week.       Increase Strength and Stamina  Yes       Intervention  Provide advice, education, support and  counseling about physical activity/exercise needs.;Develop an individualized exercise prescription for aerobic and resistive training based on initial evaluation findings, risk stratification, comorbidities and participant's personal goals.       Expected Outcomes  Short Term: Increase workloads from initial exercise prescription for resistance, speed, and METs.;Short Term: Perform resistance training exercises routinely during rehab and add in resistance training at home;Long Term: Improve cardiorespiratory fitness, muscular endurance and strength as measured by increased METs and functional capacity (6MWT)       Able to understand and use rate of perceived exertion (RPE) scale  Yes       Intervention  Provide education and explanation on how to use RPE scale       Expected Outcomes  Short Term: Able to use RPE daily in rehab to express subjective intensity level;Long Term:  Able to use RPE to guide intensity level when exercising independently       Able to understand and use Dyspnea scale  Yes       Intervention  Provide education and explanation on how to use Dyspnea scale       Expected Outcomes  Short Term: Able to use Dyspnea scale daily in rehab to express subjective sense of shortness of breath during exertion;Long Term: Able to use Dyspnea scale to guide intensity level when exercising independently       Knowledge and understanding of Target Heart Rate Range (THRR)  Yes       Intervention  Provide education and explanation of THRR including how the numbers were predicted and where they are located for reference       Expected Outcomes  Short Term: Able to state/look up THRR;Long Term: Able to use THRR to govern intensity when exercising independently;Short Term: Able to use daily as guideline for intensity in rehab       Able to check pulse independently  Yes       Intervention  Provide education and demonstration on how to check pulse in carotid and radial arteries.;Review the importance of  being able to check your own pulse for safety during independent exercise       Expected Outcomes  Short Term: Able to explain why pulse checking is important during independent exercise;Long Term: Able to check pulse independently and accurately       Understanding of Exercise Prescription  Yes       Intervention  Provide education, explanation, and written materials on patient's individual exercise prescription       Expected Outcomes  Short Term: Able to explain program exercise prescription;Long Term: Able to explain home exercise prescription to exercise independently          Exercise Goals Re-Evaluation : Exercise Goals Re-Evaluation    Row Name 12/19/17 1154 12/27/17 1659 01/01/18 0824         Exercise Goal Re-Evaluation   Exercise Goals Review  Increase Physical Activity;Increase Strength and Stamina;Able to understand and use rate of perceived exertion (RPE) scale  Increase Physical Activity;Increase Strength and Stamina;Able to understand and use rate of perceived exertion (RPE) scale;Knowledge and understanding of Target Heart Rate Range (THRR);Understanding of Exercise Prescription;Able to check pulse independently  Increase Physical Activity;Increase Strength and Stamina;Able to understand and use rate of perceived exertion (RPE) scale;Knowledge and understanding of Target Heart Rate Range (THRR);Understanding of Exercise Prescription  Comments  Dillin makes good effort when he exercises.  Staff will encourage him to start walking this week.  Reviewed home exercise with pt today.  Pt plans to use exercise chair with DVD's and walk at home for exercise.  Reviewed THR, pulse, RPE, sign and symptoms, NTG use, and when to call 911 or [place].  Also discussed weather considerations and indoor options.  Pt voiced understanding.  Smiley was able to walk all the way from elevator to gym without rest.  He has better ROM for flexibility exercises.       Expected Outcomes  Short - add walking to  exercise Long - increase overall MET level  Short: add 1-2 days of exercise at home on off days of cardiac rehab class. Long: Become indepenedent with exericse routine.   Short - walk to and from elevator and gym  Long - walk without walker         Discharge Exercise Prescription (Final Exercise Prescription Changes): Exercise Prescription Changes - 01/01/18 0800      Response to Exercise   Blood Pressure (Admit)  138/64    Blood Pressure (Exercise)  126/66    Heart Rate (Admit)  70 bpm    Heart Rate (Exercise)  103 bpm    Heart Rate (Exit)  73 bpm    Rating of Perceived Exertion (Exercise)  11    Symptoms  did not feel PVCs    Duration  Progress to 45 minutes of aerobic exercise without signs/symptoms of physical distress    Intensity  Other (comment)   pacemaker     Progression   Progression  Continue to progress workloads to maintain intensity without signs/symptoms of physical distress.    Average METs  2      Resistance Training   Training Prescription  Yes    Weight  3 lb    Reps  10-15      Interval Training   Interval Training  No      T5 Nustep   Level  1    SPM  60    Minutes  15      Track   Laps  12    METs  1.55      Home Exercise Plan   Plans to continue exercise at  Home (comment)   exercise chair with DVD's at home, and walking   Frequency  Add 2 additional days to program exercise sessions.    Initial Home Exercises Provided  12/27/17       Nutrition:  Target Goals: Understanding of nutrition guidelines, daily intake of sodium <156m, cholesterol <2054m calories 30% from fat and 7% or less from saturated fats, daily to have 5 or more servings of fruits and vegetables.  Biometrics: Pre Biometrics - 12/10/17 1651      Pre Biometrics   Height  5' 10"  (1.778 m)    Weight  (!) 310 lb 6.4 oz (140.8 kg)    Waist Circumference  55 inches    Hip Circumference  54 inches    Waist to Hip Ratio  1.02 %    BMI (Calculated)  44.54        Nutrition  Therapy Plan and Nutrition Goals: Nutrition Therapy & Goals - 12/12/17 0941      Nutrition Therapy   Diet  DM    Drug/Food Interactions  Statins/Certain Fruits    Protein (specify units)  12oz    Fiber  35 grams    Whole Grain Foods  3 servings   chooses some whole grains   Saturated Fats  16 max. grams    Fruits and Vegetables  6 servings/day   8 ideal; typically eats 1 serving of fruit per day   Sodium  1500 grams      Personal Nutrition Goals   Nutrition Goal  Add a protien source at breakfast and snacks more often to help better manage blood sugars.    Personal Goal #2  Eat less foods that contain added sugar IE doughnuts, cookies and candy. Instead, trial the new snack options you recently purchased such as yogurt, nuts and cheese    Personal Goal #3  Try not to skip meals during the day. If you are not hungry for lunch, at least add a protein source with your typical fruit    Comments  Pt reports his BG readings to be varying more than normal lately. Recent BG readings range from 165-190. Breakfast: oatmeal, cereal like Raisin Bran, eggs and 2 slices bacon, or doughnuts occasionally; Lunch: may skip and have a snack of fruit; Dinner: pork chops, hamburgers, pot pie, cabbage, chicken, broccoli, other vegetable varieties. They eat meals mostly at home and his wife does not cook with salt. He drinks water and coffee. They try to "watch" their starch intake in addition to salt. Per August 2019 labs sodium was below normal limits.       Intervention Plan   Intervention  Prescribe, educate and counsel regarding individualized specific dietary modifications aiming towards targeted core components such as weight, hypertension, lipid management, diabetes, heart failure and other comorbidities.    Expected Outcomes  Long Term Goal: Adherence to prescribed nutrition plan.;Short Term Goal: A plan has been developed with personal nutrition goals set during dietitian appointment.;Short Term Goal:  Understand basic principles of dietary content, such as calories, fat, sodium, cholesterol and nutrients.       Nutrition Assessments: Nutrition Assessments - 09/13/17 1445      MEDFICTS Scores   Pre Score  58       Nutrition Goals Re-Evaluation: Nutrition Goals Re-Evaluation    Row Name 12/12/17 1047 12/19/17 1732           Goals   Nutrition Goal  Eat less foods that contain added sugar IE doughnuts, cookies and candy. Instead, trial the new snack options you recently purchased such as yogurt, nuts and cheese  Add a protein source at breakfast and snacks more often to help better manage blood sugars; Eat less foods that contain added sugars and instead trial new/lower sugar snack options; Try not to skip meals during the day- if you are not hungry mid-day at least have a snack, ideally that contains both CHO and PRO      Comment  He keeps candy on him on most days that he uses both as a snack and to treat hypoglycemia. He and his wife split half a dozen doughnuts on occasion for breakfast. Per wife, they recently purchased different snack options to try that are more nutrient-dense which they plan to trial this week  He has been working with his wife to use less salt and consume less added sugar foods. He is learning over time to practice better portion control and eat foods he enjoys in moderation. For example, rather than 6 doughnuts a day he has made it a goal to have 1 doughnut every month, and is also learning to be satisfied with one cooking rather than several as a snack. He trialed the  yogurt as a snack and enjoys it. At breakfast he has been including eggs occasionally for a protein source      Expected Outcome  He will eat less foods that contain added sugars and replace these with foods that have natural sugars and/or that have more nutrient density  He will continue to practice portion control and moderation and learn what balanced eating means for himself. He will be more  consistent about adding a protein source at meals / snacks and to choose lower sugar items overall        Personal Goal #2 Re-Evaluation   Personal Goal #2  Add a protein source at breakfast and snacks more often to better manage blood sugars  -        Personal Goal #3 Re-Evaluation   Personal Goal #3  Try not to skip meals during the day. If you are not hungry for lunch, at least add a protein source with your typical fruit  -         Nutrition Goals Discharge (Final Nutrition Goals Re-Evaluation): Nutrition Goals Re-Evaluation - 12/19/17 1732      Goals   Nutrition Goal  Add a protein source at breakfast and snacks more often to help better manage blood sugars; Eat less foods that contain added sugars and instead trial new/lower sugar snack options; Try not to skip meals during the day- if you are not hungry mid-day at least have a snack, ideally that contains both CHO and PRO    Comment  He has been working with his wife to use less salt and consume less added sugar foods. He is learning over time to practice better portion control and eat foods he enjoys in moderation. For example, rather than 6 doughnuts a day he has made it a goal to have 1 doughnut every month, and is also learning to be satisfied with one cooking rather than several as a snack. He trialed the yogurt as a snack and enjoys it. At breakfast he has been including eggs occasionally for a protein source    Expected Outcome  He will continue to practice portion control and moderation and learn what balanced eating means for himself. He will be more consistent about adding a protein source at meals / snacks and to choose lower sugar items overall       Psychosocial: Target Goals: Acknowledge presence or absence of significant depression and/or stress, maximize coping skills, provide positive support system. Participant is able to verbalize types and ability to use techniques and skills needed for reducing stress and depression.    Initial Review & Psychosocial Screening: Initial Psych Review & Screening - 09/13/17 1443      Initial Review   Current issues with  Current Stress Concerns    Source of Stress Concerns  Chronic Illness;Unable to perform yard/household activities;Unable to participate in former interests or hobbies      [place]?  Yes   wife     Barriers   Psychosocial barriers to participate in program  There are no identifiable barriers or psychosocial needs.;The patient should benefit from training in stress management and relaxation.      Screening Interventions   Interventions  Encouraged to exercise;Program counselor consult;To provide support and resources with identified psychosocial needs;Provide feedback about the scores to participant    Expected Outcomes  Short Term goal: Utilizing psychosocial counselor, staff and physician to assist with identification of specific Stressors or current issues interfering  with healing process. Setting desired goal for each stressor or current issue identified.;Long Term Goal: Stressors or current issues are controlled or eliminated.;Short Term goal: Identification and review with participant of any Quality of Life or Depression concerns found by scoring the questionnaire.;Long Term goal: The participant improves quality of Life and PHQ9 Scores as seen by post scores and/or verbalization of changes       Quality of Life Scores:  Quality of Life - 09/13/17 1444      Quality of Life   Select  Quality of Life      Quality of Life Scores   Health/Function Pre  12.29 %    Socioeconomic Pre  19.83 %    Psych/Spiritual Pre  25.57 %    Family Pre  19.3 %    GLOBAL Pre  17.7 %      Scores of 19 and below usually indicate a poorer quality of life in these areas.  A difference of  2-3 points is a clinically meaningful difference.  A difference of 2-3 points in the total score of the Quality of Life Index has been associated with  significant improvement in overall quality of life, self-image, physical symptoms, and general health in studies assessing change in quality of life.  PHQ-9: Recent Review Flowsheet Data    Depression screen [facility] 2/9 12/10/2017 09/13/2017   Decreased Interest 0 2   Down, Depressed, Hopeless 2 2   PHQ - 2 Score 2 4   Altered sleeping 1 2   Tired, decreased energy 3 3   Change in appetite 1 0   Feeling bad or failure about yourself  2 0   Trouble concentrating 3 2   Moving slowly or fidgety/restless 3 0   Suicidal thoughts 0 0   PHQ-9 Score 15 11   Difficult doing work/chores Not difficult at all Somewhat difficult     Interpretation of Total Score  Total Score Depression Severity:  1-4 = Minimal depression, 5-9 = Mild depression, 10-14 = Moderate depression, 15-19 = Moderately severe depression, 20-27 = Severe depression   Psychosocial Evaluation and Intervention: Psychosocial Evaluation - 12/10/17 1739      Psychosocial Evaluation & Interventions   Interventions  Stress management education;Encouraged to exercise with the program and follow exercise prescription    Comments  Counselor met with Mr. Croom [facility]) accompanied by his spouse - Everlene Farrier today for an initial psychsocial evaluation.  He is a 77 year old who has COPD and has been in and out of the hospital since August - due to falling;dehydration;and low potassium.  He also struggles with diabetes and HBP.  Seddrick has a strong support system with his spouse, his daughter in [place]; & spouse's extensive family locally.  He sleeps well with the use of a CPAP and has a good appetite.  Tannon reports a history of depression and is on medication to help with this. He states he is typically in a good mood but his spouse reports this being up and down at times with irritability on occasion.  His goals for this program are to feel stronger; have more energy; be able to walk and be more flexible to tie his own shoes.  He would like to lose some  weight also if possible.  Counselor informed Demauri of his PHQ-9 scores of 15 -indicating moderately severe symptoms of depression with feeling down or depressed and feeling bad about himself in his current health condition.  Matther also reported his sister passed away  yesterday and this could impact his scores/mood today.  Counselor encouraged Jeramy to attend and exercise consistently; to find ways to manage his stress and have support in his grief at this time.  Counselor will follow with him.     Expected Outcomes  Short:  Brayon will attend and exercise consistently for his health and mental health.  Cassiel will participate in the psychoeducational components of this program - including stress and depression to learn positive coping strategies.  Long:  Jerman will develop a routine of positive self-care for his health and mental health.    Continue Psychosocial Services   Follow up required by counselor       Psychosocial Re-Evaluation:   Psychosocial Discharge (Final Psychosocial Re-Evaluation):   Vocational Rehabilitation: Provide vocational rehab assistance to qualifying candidates.   Vocational Rehab Evaluation & Intervention: Vocational Rehab - 09/13/17 1446      Initial Vocational Rehab Evaluation & Intervention   Assessment shows need for Vocational Rehabilitation  No       Education: Education Goals: Education classes will be provided on a variety of topics geared toward better understanding of heart health and risk factor modification. Participant will state understanding/return demonstration of topics presented as noted by education test scores.  Learning Barriers/Preferences: Learning Barriers/Preferences - 09/13/17 1445      Learning Barriers/Preferences   Learning Barriers  Hearing    Learning Preferences  Computer/Internet;Individual Instruction       Education Topics:  AED/CPR: - Group verbal and written instruction with the use of models to demonstrate the  basic use of the AED with the basic ABC's of resuscitation.   General Nutrition Guidelines/Fats and Fiber: -Group instruction provided by verbal, written material, models and posters to present the general guidelines for heart healthy nutrition. Gives an explanation and review of dietary fats and fiber.   Controlling Sodium/Reading Food Labels: -Group verbal and written material supporting the discussion of sodium use in heart healthy nutrition. Review and explanation with models, verbal and written materials for utilization of the food label.   Cardiac Rehab from 01/07/2018 in [facility] Cardiac and Pulmonary Rehab  Date  12/12/17  Educator  LB  Instruction Review Code  1- Verbalizes Understanding      Exercise Physiology & General Exercise Guidelines: - Group verbal and written instruction with models to review the exercise physiology of the cardiovascular system and associated critical values. Provides general exercise guidelines with specific guidelines to those with heart or lung disease.    Aerobic Exercise & Resistance Training: - Gives group verbal and written instruction on the various components of exercise. Focuses on aerobic and resistive training programs and the benefits of this training and how to safely progress through these programs..   Cardiac Rehab from 01/07/2018 in [facility] Cardiac and Pulmonary Rehab  Date  12/26/17  Educator  Nada Maclachlan, EP  Instruction Review Code  2- Demonstrated Understanding      Flexibility, Balance, Mind/Body Relaxation: Provides group verbal/written instruction on the benefits of flexibility and balance training, including mind/body exercise modes such as yoga, pilates and tai chi.  Demonstration and skill practice provided.   Cardiac Rehab from 01/07/2018 in [facility] Cardiac and Pulmonary Rehab  Date  12/31/17  Educator  AS  Instruction Review Code  1- Verbalizes Understanding      Stress and Anxiety: - Provides group verbal and written  instruction about the health risks of elevated stress and causes of high stress.  Discuss the correlation between heart/lung disease and anxiety  and treatment options. Review healthy ways to manage with stress and anxiety.   Depression: - Provides group verbal and written instruction on the correlation between heart/lung disease and depressed mood, treatment options, and the stigmas associated with seeking treatment.   Anatomy & Physiology of the Heart: - Group verbal and written instruction and models provide basic cardiac anatomy and physiology, with the coronary electrical and arterial systems. Review of Valvular disease and Heart Failure   Cardiac Procedures: - Group verbal and written instruction to review commonly prescribed medications for heart disease. Reviews the medication, class of the drug, and side effects. Includes the steps to properly store meds and maintain the prescription regimen. (beta blockers and nitrates)   Cardiac Medications I: - Group verbal and written instruction to review commonly prescribed medications for heart disease. Reviews the medication, class of the drug, and side effects. Includes the steps to properly store meds and maintain the prescription regimen.   Cardiac Medications II: -Group verbal and written instruction to review commonly prescribed medications for heart disease. Reviews the medication, class of the drug, and side effects. (all other drug classes)   Cardiac Rehab from 01/07/2018 in [facility] Cardiac and Pulmonary Rehab  Date  01/07/18  Educator  CE  Instruction Review Code  1- Verbalizes Understanding       Go Sex-Intimacy & Heart Disease, Get SMART - Goal Setting: - Group verbal and written instruction through game format to discuss heart disease and the return to sexual intimacy. Provides group verbal and written material to discuss and apply goal setting through the application of the S.M.A.R.T. Method.   Other Matters of the Heart: -  Provides group verbal, written materials and models to describe Stable Angina and Peripheral Artery. Includes description of the disease process and treatment options available to the cardiac patient.   Exercise & Equipment Safety: - Individual verbal instruction and demonstration of equipment use and safety with use of the equipment.   Cardiac Rehab from 01/07/2018 in [facility] Cardiac and Pulmonary Rehab  Date  09/13/17  Educator  [facility]  Instruction Review Code  1- Verbalizes Understanding      Infection Prevention: - Provides verbal and written material to individual with discussion of infection control including proper hand washing and proper equipment cleaning during exercise session.   Cardiac Rehab from 01/07/2018 in [facility] Cardiac and Pulmonary Rehab  Date  09/13/17  Educator  [facility]  Instruction Review Code  1- Verbalizes Understanding      Falls Prevention: - Provides verbal and written material to individual with discussion of falls prevention and safety.   Cardiac Rehab from 01/07/2018 in [facility] Cardiac and Pulmonary Rehab  Date  09/13/17  Educator  [facility]  Instruction Review Code  1- Verbalizes Understanding      Diabetes: - Individual verbal and written instruction to review signs/symptoms of diabetes, desired ranges of glucose level fasting, after meals and with exercise. Acknowledge that pre and post exercise glucose checks will be done for 3 sessions at entry of program.   Cardiac Rehab from 01/07/2018 in [facility] Cardiac and Pulmonary Rehab  Date  09/13/17  Educator  [facility]  Instruction Review Code  1- Verbalizes Understanding      Know Your Numbers and Risk Factors: -Group verbal and written instruction about important numbers in your health.  Discussion of what are risk factors and how they play a role in the disease process.  Review of Cholesterol, Blood Pressure, Diabetes, and BMI and the role they play in your  overall health.   Cardiac Rehab from 01/07/2018 in [facility] Cardiac and  Pulmonary Rehab  Date  01/07/18  Educator  CE  Instruction Review Code  1- Verbalizes Understanding      Sleep Hygiene: -Provides group verbal and written instruction about how sleep can affect your health.  Define sleep hygiene, discuss sleep cycles and impact of sleep habits. Review good sleep hygiene tips.    Cardiac Rehab from 01/07/2018 in [facility] Cardiac and Pulmonary Rehab  Date  12/19/17  Educator  [facility]  Instruction Review Code  1- Verbalizes Understanding      Other: -Provides group and verbal instruction on various topics (see comments)   Knowledge Questionnaire Score: Knowledge Questionnaire Score - 09/13/17 1446      Knowledge Questionnaire Score   Pre Score  19/26   correct answers reviewed with Leelyn, focus on Exercise and Nutrition      Core Components/Risk Factors/Patient Goals at Admission: Personal Goals and Risk Factors at Admission - 09/13/17 1446      Core Components/Risk Factors/Patient Goals on Admission    Weight Management  Yes;Obesity;Weight Loss    Intervention  Weight Management: Develop a combined nutrition and exercise program designed to reach desired caloric intake, while maintaining appropriate intake of nutrient and fiber, sodium and fats, and appropriate energy expenditure required for the weight goal.;Weight Management: Provide education and appropriate resources to help participant work on and attain dietary goals.;Weight Management/Obesity: Establish reasonable short term and long term weight goals.;Obesity: Provide education and appropriate resources to help participant work on and attain dietary goals.    Admit Weight  312 lb (141.5 kg)    Goal Weight: Short Term  308 lb (139.7 kg)    Goal Weight: Long Term  270 lb (122.5 kg)    Expected Outcomes  Short Term: Continue to assess and modify interventions until short term weight is achieved;Long Term: Adherence to nutrition and physical activity/exercise program aimed toward attainment of  established weight goal;Weight Loss: Understanding of general recommendations for a balanced deficit meal plan, which promotes 1-2 lb weight loss per week and includes a negative energy balance of [phone] kcal/d;Understanding recommendations for meals to include 15-35% energy as protein, 25-35% energy from fat, 35-60% energy from carbohydrates, less than 225m of dietary cholesterol, 20-35 gm of total fiber daily;Understanding of distribution of calorie intake throughout the day with the consumption of 4-5 meals/snacks    Diabetes  Yes    Intervention  Provide education about signs/symptoms and action to take for hypo/hyperglycemia.;Provide education about proper nutrition, including hydration, and aerobic/resistive exercise prescription along with prescribed medications to achieve blood glucose in normal ranges: Fasting glucose 65-99 mg/dL    Expected Outcomes  Short Term: Participant verbalizes understanding of the signs/symptoms and immediate care of hyper/hypoglycemia, proper foot care and importance of medication, aerobic/resistive exercise and nutrition plan for blood glucose control.;Long Term: Attainment of HbA1C < 7%.    Heart Failure  Yes    Intervention  Provide a combined exercise and nutrition program that is supplemented with education, support and counseling about heart failure. Directed toward relieving symptoms such as shortness of breath, decreased exercise tolerance, and extremity edema.    Expected Outcomes  Improve functional capacity of life;Short term: Attendance in program 2-3 days a week with increased exercise capacity. Reported lower sodium intake. Reported increased fruit and vegetable intake. Reports medication compliance.;Short term: Daily weights obtained and reported for increase. Utilizing diuretic protocols set by physician.;Long term: Adoption of self-care skills and reduction of  barriers for early signs and symptoms recognition and intervention leading to self-care  maintenance.    Hypertension  Yes    Intervention  Provide education on lifestyle modifcations including regular physical activity/exercise, weight management, moderate sodium restriction and increased consumption of fresh fruit, vegetables, and low fat dairy, alcohol moderation, and smoking cessation.;Monitor prescription use compliance.    Expected Outcomes  Short Term: Continued assessment and intervention until BP is < 140/57m HG in hypertensive participants. < 130/842mHG in hypertensive participants with diabetes, heart failure or chronic kidney disease.;Long Term: Maintenance of blood pressure at goal levels.    Lipids  Yes    Intervention  Provide education and support for participant on nutrition & aerobic/resistive exercise along with prescribed medications to achieve LDL <7069mHDL >27m34m  Expected Outcomes  Short Term: Participant states understanding of desired cholesterol values and is compliant with medications prescribed. Participant is following exercise prescription and nutrition guidelines.;Long Term: Cholesterol controlled with medications as prescribed, with individualized exercise RX and with personalized nutrition plan. Value goals: LDL < 70mg85mL > 40 mg.       Core Components/Risk Factors/Patient Goals Review:  Goals and Risk Factor Review    Row Name 12/19/17 1551             Core Components/Risk Factors/Patient Goals Review   Personal Goals Review  Lipids;Hypertension;Diabetes       Review  Ovide has been measuring BP and BG at home.  BG has been better since he started exercising.  He wants it to be below 150 fasting.  Thurmon states he is doing well with exercise - his legs are getting stronger and he looks forward to coming.  He is taking meds as directed.  He wants to get to 270 lb.  He was 302 today       Expected Outcomes  Short - meet with RD about healthy dietary habits Long - achieve BG lower than 150, add walking to exercise Long - walk to and from  elevator (without walker)           Core Components/Risk Factors/Patient Goals at Discharge (Final Review):  Goals and Risk Factor Review - 12/19/17 1551      Core Components/Risk Factors/Patient Goals Review   Personal Goals Review  Lipids;Hypertension;Diabetes    Review  Melesio has been measuring BP and BG at home.  BG has been better since he started exercising.  He wants it to be below 150 fasting.  Sumit states he is doing well with exercise - his legs are getting stronger and he looks forward to coming.  He is taking meds as directed.  He wants to get to 270 lb.  He was 302 today    Expected Outcomes  Short - meet with RD about healthy dietary habits Long - achieve BG lower than 150, add walking to exercise Long - walk to and from elevator (without walker)        ITP Comments: ITP Comments    Row Name 09/13/17 1442 09/13/17 1453 09/19/17 0910 10/09/17 1554 10/17/17 0546   ITP Comments  Med Review completed. Initial ITP created. [facility]  Declyn completed 2 min of the 6 min walk and needed to rest. While sitting, it was noted his heart rate was sustaining in 130s. SOB was relieved with rest and he thought it was because he usually gets SOB with movement. He rested in chair for 15 minutes  and HR still maintained int he 130s. Patient brought upstairs to ED to be evaluated.   30 day review completed. ITP sent to Dr. Ramonita Lab, covering for Dr. Emily Filbert, Medical Director of Cardiac Rehab. Continue with ITP unless changes are made by physician.  new to program. Has not started exercise yet.   Dr. Doren Custard called to let us know that he has a follow up appointment with Kamdin next week.  Mr. Caspers was just discharged last week from a gout flare up and had some SVT while admitted then.  Dr. Doren Custard is considering a stress test prior to releasing him to start rehab.  He will let us know when Mr. Dartt can start after his appointment next week.   30  day review completed. ITP sent to Dr. Emily Filbert, Medical Director of Cardiac Rehab. Continue with ITP unless changes are made by physician  HAs attended med review,out for medical concerns   [place] Name 10/24/17 1529 11/14/17 0652 12/05/17 0946 12/10/17 1726 12/12/17 0612   ITP Comments  Dr. Doren Custard called to update [place] on Mr. Oats.  He was supposed to do a stress test yesterday, but present volume overloaded.  Dr. Doren Custard made some medication adjustements to try to optimize him.  He will have Mr. Russey come back in 2 weeks for his stress test and will update [place] on his status after that.   30 day review.  Continue with ITP unless directed changes per Medical Director review.  Remians out for medical reasons  Received clearance for [facility] to start rehab.  Left message on voice mail at home to call to schedule first day.    Patient came today with clearance from doctor to resume rehab. It has been 2 months since his initial walk test so 6 min walk test was repeated today. Patient also met with dietician and mental health counselor today.   30 day review. Continue with ITP unless direccted changes per Medical Director Chart Review.     [place] Name 01/09/18 0621           ITP Comments  30 day review. Continue with ITP unless direccted changes per Medical Director Chart Review.          Comments:

## 2018-01-16 ENCOUNTER — Encounter: Payer: No Typology Code available for payment source | Admitting: *Deleted

## 2018-01-16 DIAGNOSIS — I5022 Chronic systolic (congestive) heart failure: Secondary | ICD-10-CM

## 2018-01-16 NOTE — Progress Notes (Signed)
Daily Session Note  Patient Details  Name: Trevor Jennings MRN: [id number] Date of Birth: 02/06/1941 Referring Provider:     Cardiac Rehab from 09/13/2017 in [facility] Cardiac [facility] Pul[facility]  [facility]      Encounter Date: 01/16/2018  Check In: Session Check In - 01/16/18 1626      Check-In   Supervising physician immediately available to respond to emergencies  See telemetry face sheet for immediately available ER MD    Location  [facility]-Cardiac & Pulmonary Rehab    Staff Present  Justin Mend RCP,RRT,BSRT;Amanda Oletta Darter, BA, ACSM CEP, Exercise Physiologist;Carroll Enterkin, RN, BSN    Medication changes reported      No    Fall or balance concerns reported     No    Warm-up and Cool-down  Performed as group-led instruction    Resistance Training Performed  Yes    VAD Patient?  No    PAD/SET Patient?  No      Pain Assessment   Currently in Pain?  No/denies          Social History   Tobacco Use  Smoking Status Former Smoker  . Last attempt to quit: 1995  . Years since quitting: 24.9  Smokeless Tobacco Never Used    Goals Met:  Independence with exercise equipment Exercise tolerated well No report of cardiac concerns or symptoms Strength training completed today  Goals Unmet:  Not Applicable  Comments: Pt able to follow exercise prescription today without complaint.  Will continue to monitor for progression.    Dr. Emily Filbert is Medical Director for [place] [facility].

## 2018-01-17 DIAGNOSIS — I5022 Chronic systolic (congestive) heart failure: Secondary | ICD-10-CM

## 2018-01-17 NOTE — Progress Notes (Signed)
Daily Session Note  Patient Details  Name: Trevor Jennings MRN: [id number] Date of Birth: 1940/06/19 Referring Provider:     Cardiac Rehab from 09/13/2017 in [facility] Cardiac and Pulmonary Rehab  [facility]      Encounter Date: 01/17/2018  Check In: Session Check In - 01/17/18 1557      Check-In   Supervising physician immediately available to respond to emergencies  See telemetry face sheet for immediately available ER MD    Location  [facility]-Cardiac & Pulmonary Rehab    Staff Present  Justin Mend Jaci Carrel, BS, ACSM CEP, Exercise Physiologist;Meredith Sherryll Burger, RN BSN    Medication changes reported      No    Fall or balance concerns reported     No    Warm-up and Cool-down  Performed as group-led Higher education careers adviser Performed  Yes    VAD Patient?  No    PAD/SET Patient?  No      Pain Assessment   Currently in Pain?  No/denies          Social History   Tobacco Use  Smoking Status Former Smoker  . Last attempt to quit: 1995  . Years since quitting: 24.9  Smokeless Tobacco Never Used    Goals Met:  Independence with exercise equipment Exercise tolerated well No report of cardiac concerns or symptoms Strength training completed today  Goals Unmet:  Not Applicable  Comments: Pt able to follow exercise prescription today without complaint.  Will continue to monitor for progression.    Dr. Emily Filbert is Medical Director for [place] and [facility].

## 2018-01-23 ENCOUNTER — Encounter: Payer: Self-pay | Admitting: *Deleted

## 2018-01-23 ENCOUNTER — Telehealth: Payer: Self-pay | Admitting: *Deleted

## 2018-01-23 DIAGNOSIS — I5022 Chronic systolic (congestive) heart failure: Secondary | ICD-10-CM

## 2018-01-23 NOTE — Telephone Encounter (Signed)
Mrs. Dorr called to let us know that Irma has been sick since Monday afternoon.  He is very weak.

## 2018-01-24 ENCOUNTER — Ambulatory Visit: Payer: No Typology Code available for payment source | Admitting: Podiatry

## 2018-02-05 ENCOUNTER — Encounter: Payer: Self-pay | Admitting: *Deleted

## 2018-02-05 DIAGNOSIS — I5022 Chronic systolic (congestive) heart failure: Secondary | ICD-10-CM

## 2018-02-05 NOTE — Progress Notes (Signed)
Cardiac Individual Treatment Plan  Patient Details  Name: Trevor Jennings MRN: [id number] Date of Birth: Nov 25, 1940 Referring Provider:     Cardiac Rehab from 09/13/2017 in [facility]  [facility]      Initial Encounter Date:    Cardiac Rehab from 09/13/2017 in [facility]  Date  09/13/17      Visit Diagnosis: Heart failure, chronic systolic ([place])  Patient's Home Medications on Admission:  Current Outpatient Medications:  .  albuterol (PROVENTIL HFA;VENTOLIN HFA) 108 (90 Base) MCG/ACT inhaler, Inhale into the lungs., Disp: , Rfl:  .  aspirin EC 81 MG tablet, Take by mouth., Disp: , Rfl:  .  atorvastatin (LIPITOR) 80 MG tablet, Take by mouth., Disp: , Rfl:  .  carvedilol (COREG) 25 MG tablet, Take by mouth., Disp: , Rfl:  .  Cholecalciferol (VITAMIN D-1000 MAX ST) 1000 units tablet, Take by mouth., Disp: , Rfl:  .  citalopram (CELEXA) 40 MG tablet, Take by mouth., Disp: , Rfl:  .  fluticasone (FLONASE) 50 MCG/ACT nasal spray, 2 sprays by Each Nare route daily., Disp: , Rfl:  .  gabapentin (NEURONTIN) 100 MG capsule, Take by mouth., Disp: , Rfl:  .  insulin glargine (LANTUS) 100 UNIT/ML injection, Inject into the skin., Disp: , Rfl:  .  insulin regular (NOVOLIN R,HUMULIN R) 100 units/mL injection, Inject into the skin., Disp: , Rfl:  .  loratadine (CLARITIN) 10 MG tablet, Take 10 mg by mouth daily., Disp: , Rfl:  .  pantoprazole (PROTONIX) 40 MG tablet, Take by mouth., Disp: , Rfl:  .  torsemide (DEMADEX) 20 MG tablet, Take by mouth., Disp: , Rfl:   Past Medical History: No past medical history on file.  Tobacco Use: Social History   Tobacco Use  Smoking Status Former Smoker  . Last attempt to quit: 1995  . Years since quitting: 25.0  Smokeless Tobacco Never Used    Labs: Recent Review Flowsheet Data    There is no flowsheet data to display.       Exercise Target Goals: Exercise Program Goal: Individual exercise  prescription set using results from initial 6 min walk test and THRR while considering  patient's activity barriers and safety.   Exercise Prescription Goal: Initial exercise prescription builds to 30-45 minutes a day of aerobic activity, 2-3 days per week.  Home exercise guidelines will be given to patient during program as part of exercise prescription that the participant will acknowledge.  Activity Barriers & Risk Stratification: Activity Barriers & Cardiac Risk Stratification - 09/13/17 1455      Activity Barriers & Cardiac Risk Stratification   Activity Barriers  Back Problems;Shortness of Breath    Cardiac Risk Stratification  High       6 Minute Walk: 6 Minute Walk    Row Name 09/13/17 1426 12/10/17 1646       6 Minute Walk   Phase  -  Initial    Distance  200 feet  380 feet    Walk Time  2 minutes  2.75 minutes    # of Rest Breaks  -  3    MPH  1.13  0.72    RPE  13  13    Perceived Dyspnea   2  -    Symptoms  Yes (comment)  Yes (comment)    Comments  short of breath - stopped at 2:00   shortness of breath and tiredness    Resting HR  88 bpm  64 bpm    Resting BP  110/64  110/70    Resting Oxygen Saturation   94 %  99 %    Exercise Oxygen Saturation  during 6 min walk  97 %  100 %    Max Ex. HR  144 bpm  77 bpm    Max Ex. BP  124/60  140/70    2 Minute Post BP  110/56  140/70       Oxygen Initial Assessment:   Oxygen Re-Evaluation:   Oxygen Discharge (Final Oxygen Re-Evaluation):   Initial Exercise Prescription: Initial Exercise Prescription - 09/13/17 1400      Date of Initial Exercise RX and Referring Provider   Date  09/13/17    [facility]      Treadmill   MPH  0.8    Grade  0    Minutes  15   rest as needed   METs  1      T5 Nustep   Level  1    SPM  60    Minutes  15      Biostep-RELP   Level  1    SPM  50    Minutes  15    METs  1      Track   Laps  10    Minutes  15   rest as needed   METs  1      Prescription  Details   Frequency (times per week)  3    Duration  Progress to 45 minutes of aerobic exercise without signs/symptoms of physical distress      Intensity   THRR 40-80% of Max Heartrate  110-132    Ratings of Perceived Exertion  11-13    Perceived Dyspnea  0-4      Resistance Training   Training Prescription  Yes    Weight  3 lb    Reps  10-15       Perform Capillary Blood Glucose checks as needed.  Exercise Prescription Changes: Exercise Prescription Changes    Row Name 09/13/17 1400 12/10/17 1600 12/19/17 1100 12/27/17 1600 01/01/18 0800     Response to Exercise   Blood Pressure (Admit)  110/64  110/70  120/66  -  138/64   Blood Pressure (Exercise)  124/60  140/70  136/60  -  126/66   Blood Pressure (Exit)  110/56  104/70  130/82  -  -   Heart Rate (Admit)  81 bpm  64 bpm  95 bpm  -  70 bpm   Heart Rate (Exercise)  144 bpm  77 bpm  117 bpm  -  103 bpm   Heart Rate (Exit)  133 bpm  65 bpm  67 bpm  -  73 bpm   Oxygen Saturation (Admit)  96 %  99 %  -  -  -   Oxygen Saturation (Exercise)  98 %  100 %  -  -  -   Oxygen Saturation (Exit)  97 %  100 %  -  -  -   Rating of Perceived Exertion (Exercise)  13  13  15   -  11   Perceived Dyspnea (Exercise)  2  -  -  -  -   Symptoms  -  -  -  -  did not feel PVCs   Duration  -  -  Progress to 45 minutes of aerobic exercise without signs/symptoms of physical distress  Progress to 45 minutes of aerobic exercise without signs/symptoms of physical distress  Progress to 45 minutes of aerobic exercise without signs/symptoms of physical distress   Intensity  -  -  THRR unchanged  THRR unchanged  Other (comment) pacemaker     Progression   Progression  -  -  Continue to progress workloads to maintain intensity without signs/symptoms of physical distress.  Continue to progress workloads to maintain intensity without signs/symptoms of physical distress.  Continue to progress workloads to maintain intensity without signs/symptoms of physical  distress.   Average METs  -  -  2.1  2.1  2     Resistance Training   Training Prescription  -  Yes  Yes  Yes  Yes   Weight  -  3 lb  3 lb  3 lb  3 lb   Reps  -  10-15  10-15  10-15  10-15     Interval Training   Interval Training  -  -  No  No  No     Treadmill   MPH  -  0.8  -  -  -   Grade  -  0  -  -  -   Minutes  -  15 rest as needed  -  -  -   METs  -  1  -  -  -     T5 Nustep   Level  -  1  1  1  1    SPM  -  60  60  60  60   Minutes  -  15  15  15  15      Biostep-RELP   Level  -  1  1  1   -   SPM  -  50  50  50  -   Minutes  -  15  15  15   -   METs  -  1  2  2   -     Track   Laps  -  10  -  -  12   Minutes  -  15 rest as needed  -  -  -   METs  -  1  -  -  1.55     Home Exercise Plan   Plans to continue exercise at  -  -  -  Home (comment) exercise chair with DVD's at home, and walking  Home (comment) exercise chair with DVD's at home, and walking   Frequency  -  -  -  Add 2 additional days to program exercise sessions.  Add 2 additional days to program exercise sessions.   Initial Home Exercises Provided  -  -  -  12/27/17  12/27/17   Row Name 01/17/18 0800             Response to Exercise   Blood Pressure (Admit)  142/80       Blood Pressure (Exercise)  134/58       Blood Pressure (Exit)  122/70       Heart Rate (Admit)  74 bpm       Heart Rate (Exercise)  97 bpm       Heart Rate (Exit)  79 bpm       Rating of Perceived Exertion (Exercise)  13       Symptoms  none       Duration  Progress to 45 minutes of aerobic exercise without signs/symptoms of physical distress  Intensity  Other (comment) pacemaker         Progression   Progression  Continue to progress workloads to maintain intensity without signs/symptoms of physical distress.         Resistance Training   Training Prescription  Yes       Weight  3 lb       Reps  10-15         Interval Training   Interval Training  No         Treadmill   MPH  0.8       Grade  0         T5  Nustep   Level  1       SPM  60       Minutes  15         Track   Laps  14       METs  1.7         Home Exercise Plan   Plans to continue exercise at  Home (comment) exercise chair with DVD's at home, and walking       Frequency  Add 2 additional days to program exercise sessions.       Initial Home Exercises Provided  12/27/17          Exercise Comments:   Exercise Goals and Review: Exercise Goals    Row Name 09/13/17 1425             Exercise Goals   Increase Physical Activity  Yes       Intervention  Provide advice, education, support and counseling about physical activity/exercise needs.;Develop an individualized exercise prescription for aerobic and resistive training based on initial evaluation findings, risk stratification, comorbidities and participant's personal goals.       Expected Outcomes  Short Term: Attend rehab on a regular basis to increase amount of physical activity.;Long Term: Add in home exercise to make exercise part of routine and to increase amount of physical activity.;Long Term: Exercising regularly at least 3-5 days a week.       Increase Strength and Stamina  Yes       Intervention  Provide advice, education, support and counseling about physical activity/exercise needs.;Develop an individualized exercise prescription for aerobic and resistive training based on initial evaluation findings, risk stratification, comorbidities and participant's personal goals.       Expected Outcomes  Short Term: Increase workloads from initial exercise prescription for resistance, speed, and METs.;Short Term: Perform resistance training exercises routinely during rehab and add in resistance training at home;Long Term: Improve cardiorespiratory fitness, muscular endurance and strength as measured by increased METs and functional capacity (6MWT)       Able to understand and use rate of perceived exertion (RPE) scale  Yes       Intervention  Provide education and explanation  on how to use RPE scale       Expected Outcomes  Short Term: Able to use RPE daily in rehab to express subjective intensity level;Long Term:  Able to use RPE to guide intensity level when exercising independently       Able to understand and use Dyspnea scale  Yes       Intervention  Provide education and explanation on how to use Dyspnea scale       Expected Outcomes  Short Term: Able to use Dyspnea scale daily in rehab to express subjective sense of shortness of breath during exertion;Long Term: Able to use Dyspnea  scale to guide intensity level when exercising independently       Knowledge and understanding of Target Heart Rate Range (THRR)  Yes       Intervention  Provide education and explanation of THRR including how the numbers were predicted and where they are located for reference       Expected Outcomes  Short Term: Able to state/look up THRR;Long Term: Able to use THRR to govern intensity when exercising independently;Short Term: Able to use daily as guideline for intensity in rehab       Able to check pulse independently  Yes       Intervention  Provide education and demonstration on how to check pulse in carotid and radial arteries.;Review the importance of being able to check your own pulse for safety during independent exercise       Expected Outcomes  Short Term: Able to explain why pulse checking is important during independent exercise;Long Term: Able to check pulse independently and accurately       Understanding of Exercise Prescription  Yes       Intervention  Provide education, explanation, and written materials on patient's individual exercise prescription       Expected Outcomes  Short Term: Able to explain program exercise prescription;Long Term: Able to explain home exercise prescription to exercise independently          Exercise Goals Re-Evaluation : Exercise Goals Re-Evaluation    Row Name 12/19/17 1154 12/27/17 1659 01/01/18 0824 01/17/18 0831       Exercise Goal  Re-Evaluation   Exercise Goals Review  Increase Physical Activity;Increase Strength and Stamina;Able to understand and use rate of perceived exertion (RPE) scale  Increase Physical Activity;Increase Strength and Stamina;Able to understand and use rate of perceived exertion (RPE) scale;Knowledge and understanding of Target Heart Rate Range (THRR);Understanding of Exercise Prescription;Able to check pulse independently  Increase Physical Activity;Increase Strength and Stamina;Able to understand and use rate of perceived exertion (RPE) scale;Knowledge and understanding of Target Heart Rate Range (THRR);Understanding of Exercise Prescription  Increase Physical Activity;Increase Strength and Stamina;Able to understand and use rate of perceived exertion (RPE) scale    Comments  Glynn makes good effort when he exercises.  Staff will encourage him to start walking this week.  Reviewed home exercise with pt today.  Pt plans to use exercise chair with DVD's and walk at home for exercise.  Reviewed THR, pulse, RPE, sign and symptoms, NTG use, and when to call 911 or MD.  Also discussed weather considerations and indoor options.  Pt voiced understanding.  [place] was able to walk all the way from elevator to gym without rest.  He has better ROM for flexibility exercises.    [place]under continues to show improvement with endurance.  He walked 14 laps last session and tried the TM.  Staff will monitor progressions    Expected Outcomes  Short - add walking to exercise Long - increase overall MET level  Short: add 1-2 days of exercise at home on off days of cardiac rehab class. Long: Become indepenedent with exericse routine.   Short - walk to and from elevator and gym  Long - walk without walker   Short - 15 laps without stopping walking Long - increase overall endurance        Discharge Exercise Prescription (Final Exercise Prescription Changes): Exercise Prescription Changes - 01/17/18 0800      Response to Exercise    Blood Pressure (Admit)  142/80    Blood Pressure (  Exercise)  134/58    Blood Pressure (Exit)  122/70    Heart Rate (Admit)  74 bpm    Heart Rate (Exercise)  97 bpm    Heart Rate (Exit)  79 bpm    Rating of Perceived Exertion (Exercise)  13    Symptoms  none    Duration  Progress to 45 minutes of aerobic exercise without signs/symptoms of physical distress    Intensity  Other (comment)   pacemaker     Progression   Progression  Continue to progress workloads to maintain intensity without signs/symptoms of physical distress.      Resistance Training   Training Prescription  Yes    Weight  3 lb    Reps  10-15      Interval Training   Interval Training  No      Treadmill   MPH  0.8    Grade  0      T5 Nustep   Level  1    SPM  60    Minutes  15      Track   Laps  14    METs  1.7      Home Exercise Plan   Plans to continue exercise at  Home (comment)   exercise chair with DVD's at home, and walking   Frequency  Add 2 additional days to program exercise sessions.    Initial Home Exercises Provided  12/27/17       Nutrition:  Target Goals: Understanding of nutrition guidelines, daily intake of sodium <1575m, cholesterol <2026m calories 30% from fat and 7% or less from saturated fats, daily to have 5 or more servings of fruits and vegetables.  Biometrics: Pre Biometrics - 12/10/17 1651      Pre Biometrics   Height  5' 10"  (1.778 m)    Weight  (!) 310 lb 6.4 oz (140.8 kg)    Waist Circumference  55 inches    Hip Circumference  54 inches    Waist to Hip Ratio  1.02 %    BMI (Calculated)  44.54        Nutrition Therapy Plan and Nutrition Goals: Nutrition Therapy & Goals - 12/12/17 0941      Nutrition Therapy   Diet  DM    Drug/Food Interactions  Statins/Certain Fruits    Protein (specify units)  12oz    Fiber  35 grams    Whole Grain Foods  3 servings   chooses some whole grains   Saturated Fats  16 max. grams    Fruits and Vegetables  6 servings/day    8 ideal; typically eats 1 serving of fruit per day   Sodium  1500 grams      Personal Nutrition Goals   Nutrition Goal  Add a protien source at breakfast and snacks more often to help better manage blood sugars.    Personal Goal #2  Eat less foods that contain added sugar IE doughnuts, cookies and candy. Instead, trial the new snack options you recently purchased such as yogurt, nuts and cheese    Personal Goal #3  Try not to skip meals during the day. If you are not hungry for lunch, at least add a protein source with your typical fruit    Comments  Pt reports his BG readings to be varying more than normal lately. Recent BG readings range from 165-190. Breakfast: oatmeal, cereal like Raisin Bran, eggs and 2 slices bacon, or doughnuts occasionally; Lunch: may skip and  have a snack of fruit; Dinner: pork chops, hamburgers, pot pie, cabbage, chicken, broccoli, other vegetable varieties. They eat meals mostly at home and his wife does not cook with salt. He drinks water and coffee. They try to "watch" their starch intake in addition to salt. Per August 2019 labs sodium was below normal limits.       Intervention Plan   Intervention  Prescribe, educate and counsel regarding individualized specific dietary modifications aiming towards targeted core components such as weight, hypertension, lipid management, diabetes, heart failure and other comorbidities.    Expected Outcomes  Long Term Goal: Adherence to prescribed nutrition plan.;Short Term Goal: A plan has been developed with personal nutrition goals set during dietitian appointment.;Short Term Goal: Understand basic principles of dietary content, such as calories, fat, sodium, cholesterol and nutrients.       Nutrition Assessments: Nutrition Assessments - 09/13/17 1445      MEDFICTS Scores   Pre Score  58       Nutrition Goals Re-Evaluation: Nutrition Goals Re-Evaluation    Row Name 12/12/17 1047 12/19/17 1732           Goals    Nutrition Goal  Eat less foods that contain added sugar IE doughnuts, cookies and candy. Instead, trial the new snack options you recently purchased such as yogurt, nuts and cheese  Add a protein source at breakfast and snacks more often to help better manage blood sugars; Eat less foods that contain added sugars and instead trial new/lower sugar snack options; Try not to skip meals during the day- if you are not hungry mid-day at least have a snack, ideally that contains both CHO and PRO      Comment  He keeps candy on him on most days that he uses both as a snack and to treat hypoglycemia. He and his wife split half a dozen doughnuts on occasion for breakfast. Per wife, they recently purchased different snack options to try that are more nutrient-dense which they plan to trial this week  He has been working with his wife to use less salt and consume less added sugar foods. He is learning over time to practice better portion control and eat foods he enjoys in moderation. For example, rather than 6 doughnuts a day he has made it a goal to have 1 doughnut every month, and is also learning to be satisfied with one cooking rather than several as a snack. He trialed the yogurt as a snack and enjoys it. At breakfast he has been including eggs occasionally for a protein source      Expected Outcome  He will eat less foods that contain added sugars and replace these with foods that have natural sugars and/or that have more nutrient density  He will continue to practice portion control and moderation and learn what balanced eating means for himself. He will be more consistent about adding a protein source at meals / snacks and to choose lower sugar items overall        Personal Goal #2 Re-Evaluation   Personal Goal #2  Add a protein source at breakfast and snacks more often to better manage blood sugars  -        Personal Goal #3 Re-Evaluation   Personal Goal #3  Try not to skip meals during the day. If you are not  hungry for lunch, at least add a protein source with your typical fruit  -         Nutrition Goals  Discharge (Final Nutrition Goals Re-Evaluation): Nutrition Goals Re-Evaluation - 12/19/17 1732      Goals   Nutrition Goal  Add a protein source at breakfast and snacks more often to help better manage blood sugars; Eat less foods that contain added sugars and instead trial new/lower sugar snack options; Try not to skip meals during the day- if you are not hungry mid-day at least have a snack, ideally that contains both CHO and PRO    Comment  He has been working with his wife to use less salt and consume less added sugar foods. He is learning over time to practice better portion control and eat foods he enjoys in moderation. For example, rather than 6 doughnuts a day he has made it a goal to have 1 doughnut every month, and is also learning to be satisfied with one cooking rather than several as a snack. He trialed the yogurt as a snack and enjoys it. At breakfast he has been including eggs occasionally for a protein source    Expected Outcome  He will continue to practice portion control and moderation and learn what balanced eating means for himself. He will be more consistent about adding a protein source at meals / snacks and to choose lower sugar items overall       Psychosocial: Target Goals: Acknowledge presence or absence of significant depression and/or stress, maximize coping skills, provide positive support system. Participant is able to verbalize types and ability to use techniques and skills needed for reducing stress and depression.   Initial Review & Psychosocial Screening: Initial Psych Review & Screening - 09/13/17 1443      Initial Review   Current issues with  Current Stress Concerns    Source of Stress Concerns  Chronic Illness;Unable to perform yard/household activities;Unable to participate in former interests or hobbies      [place]?  Yes    wife     Barriers   Psychosocial barriers to participate in program  There are no identifiable barriers or psychosocial needs.;The patient should benefit from training in stress management and relaxation.      Screening Interventions   Interventions  Encouraged to exercise;Program counselor consult;To provide support and resources with identified psychosocial needs;Provide feedback about the scores to participant    Expected Outcomes  Short Term goal: Utilizing psychosocial counselor, staff and physician to assist with identification of specific Stressors or current issues interfering with healing process. Setting desired goal for each stressor or current issue identified.;Long Term Goal: Stressors or current issues are controlled or eliminated.;Short Term goal: Identification and review with participant of any Quality of Life or Depression concerns found by scoring the questionnaire.;Long Term goal: The participant improves quality of Life and PHQ9 Scores as seen by post scores and/or verbalization of changes       Quality of Life Scores:  Quality of Life - 09/13/17 1444      Quality of Life   Select  Quality of Life      Quality of Life Scores   Health/Function Pre  12.29 %    Socioeconomic Pre  19.83 %    Psych/Spiritual Pre  25.57 %    Family Pre  19.3 %    GLOBAL Pre  17.7 %      Scores of 19 and below usually indicate a poorer quality of life in these areas.  A difference of  2-3 points is a clinically meaningful difference.  A difference of  2-3 points in the total score of the Quality of Life Index has been associated with significant improvement in overall quality of life, self-image, physical symptoms, and general health in studies assessing change in quality of life.  PHQ-9: Recent Review Flowsheet Data    Depression screen [facility] 2/9 12/10/2017 09/13/2017   Decreased Interest 0 2   Down, Depressed, Hopeless 2 2   PHQ - 2 Score 2 4   Altered sleeping 1 2   Tired, decreased  energy 3 3   Change in appetite 1 0   Feeling bad or failure about yourself  2 0   Trouble concentrating 3 2   Moving slowly or fidgety/restless 3 0   Suicidal thoughts 0 0   PHQ-9 Score 15 11   Difficult doing work/chores Not difficult at all Somewhat difficult     Interpretation of Total Score  Total Score Depression Severity:  1-4 = Minimal depression, 5-9 = Mild depression, 10-14 = Moderate depression, 15-19 = Moderately severe depression, 20-27 = Severe depression   Psychosocial Evaluation and Intervention: Psychosocial Evaluation - 12/10/17 1739      Psychosocial Evaluation & Interventions   Interventions  Stress management education;Encouraged to exercise with the program and follow exercise prescription    Comments  Counselor met with Mr. Mckesson [employer] [employer]) accompanied by his spouse - Everlene Farrier today for an initial psychsocial evaluation.  He is a 77 year old who has COPD and has been in and out of the hospital since August - due to falling;dehydration;and low potassium.  He also struggles with diabetes and HBP.  Masud has a strong support system with his spouse, his daughter in [place]; & spouse's extensive family locally.  He sleeps well with the use of a CPAP and has a good appetite.  Lynkin reports a history of depression and is on medication to help with this. He states he is typically in a good mood but his spouse reports this being up and down at times with irritability on occasion.  His goals for this program are to feel stronger; have more energy; be able to walk and be more flexible to tie his own shoes.  He would like to lose some weight also if possible.  Counselor informed Vinson of his PHQ-9 scores of 15 -indicating moderately severe symptoms of depression with feeling down or depressed and feeling bad about himself in his current health condition.  Ammaar also reported his sister passed away  yesterday and this could impact his scores/mood today.  Counselor encouraged Zarif to  attend and exercise consistently; to find ways to manage his stress and have support in his grief at this time.  Counselor will follow with him.     Expected Outcomes  Short:  Cristin will attend and exercise consistently for his health and mental health.  Kaden will participate in the psychoeducational components of this program - including stress and depression to learn positive coping strategies.  Long:  Mar will develop a routine of positive self-care for his health and mental health.    Continue Psychosocial Services   Follow up required by counselor       Psychosocial Re-Evaluation:   Psychosocial Discharge (Final Psychosocial Re-Evaluation):   Vocational Rehabilitation: Provide vocational rehab assistance to qualifying candidates.   Vocational Rehab Evaluation & Intervention: Vocational Rehab - 09/13/17 1446      Initial Vocational Rehab Evaluation & Intervention   Assessment shows need for Vocational Rehabilitation  No       Education: Education  Goals: Education classes will be provided on a variety of topics geared toward better understanding of heart health and risk factor modification. Participant will state understanding/return demonstration of topics presented as noted by education test scores.  Learning Barriers/Preferences: Learning Barriers/Preferences - 09/13/17 1445      Learning Barriers/Preferences   Learning Barriers  Hearing    Learning Preferences  Computer/Internet;Individual Instruction       Education Topics:  AED/CPR: - Group verbal and written instruction with the use of models to demonstrate the basic use of the AED with the basic ABC's of resuscitation.   General Nutrition Guidelines/Fats and Fiber: -Group instruction provided by verbal, written material, models and posters to present the general guidelines for heart healthy nutrition. Gives an explanation and review of dietary fats and fiber.   Controlling Sodium/Reading Food  Labels: -Group verbal and written material supporting the discussion of sodium use in heart healthy nutrition. Review and explanation with models, verbal and written materials for utilization of the food label.   Cardiac Rehab from 01/16/2018 in [facility] Cardiac and Pulmonary Rehab  Date  12/12/17  Educator  LB  Instruction Review Code  1- Verbalizes Understanding      Exercise Physiology & General Exercise Guidelines: - Group verbal and written instruction with models to review the exercise physiology of the cardiovascular system and associated critical values. Provides general exercise guidelines with specific guidelines to those with heart or lung disease.    Aerobic Exercise & Resistance Training: - Gives group verbal and written instruction on the various components of exercise. Focuses on aerobic and resistive training programs and the benefits of this training and how to safely progress through these programs..   Cardiac Rehab from 01/16/2018 in [facility] Cardiac and Pulmonary Rehab  Date  12/26/17  Educator  Nada Maclachlan, EP  Instruction Review Code  2- Demonstrated Understanding      Flexibility, Balance, Mind/Body Relaxation: Provides group verbal/written instruction on the benefits of flexibility and balance training, including mind/body exercise modes such as yoga, pilates and tai chi.  Demonstration and skill practice provided.   Cardiac Rehab from 01/16/2018 in [facility] Cardiac and Pulmonary Rehab  Date  12/31/17  Educator  AS  Instruction Review Code  1- Verbalizes Understanding      Stress and Anxiety: - Provides group verbal and written instruction about the health risks of elevated stress and causes of high stress.  Discuss the correlation between heart/lung disease and anxiety and treatment options. Review healthy ways to manage with stress and anxiety.   Depression: - Provides group verbal and written instruction on the correlation between heart/lung disease and depressed  mood, treatment options, and the stigmas associated with seeking treatment.   Cardiac Rehab from 01/16/2018 in [facility] Cardiac and Pulmonary Rehab  Date  01/16/18  Educator  [facility]  Instruction Review Code  1- Verbalizes Understanding      Anatomy & Physiology of the Heart: - Group verbal and written instruction and models provide basic cardiac anatomy and physiology, with the coronary electrical and arterial systems. Review of Valvular disease and Heart Failure   Cardiac Procedures: - Group verbal and written instruction to review commonly prescribed medications for heart disease. Reviews the medication, class of the drug, and side effects. Includes the steps to properly store meds and maintain the prescription regimen. (beta blockers and nitrates)   Cardiac Rehab from 01/16/2018 in [facility] Cardiac and Pulmonary Rehab  Date  01/09/18  Educator  [facility]  Instruction Review Code  1- [employer]  Understanding      Cardiac Medications I: - Group verbal and written instruction to review commonly prescribed medications for heart disease. Reviews the medication, class of the drug, and side effects. Includes the steps to properly store meds and maintain the prescription regimen.   Cardiac Medications II: -Group verbal and written instruction to review commonly prescribed medications for heart disease. Reviews the medication, class of the drug, and side effects. (all other drug classes)   Cardiac Rehab from 01/16/2018 in [facility] Cardiac and Pulmonary Rehab  Date  01/07/18  Educator  CE  Instruction Review Code  1- Verbalizes Understanding       Go Sex-Intimacy & Heart Disease, Get SMART - Goal Setting: - Group verbal and written instruction through game format to discuss heart disease and the return to sexual intimacy. Provides group verbal and written material to discuss and apply goal setting through the application of the S.M.A.R.T. Method.   Cardiac Rehab from 01/16/2018 in [facility] Cardiac and Pulmonary  Rehab  Date  01/09/18  Educator  [facility]  Instruction Review Code  1- Verbalizes Understanding      Other Matters of the Heart: - Provides group verbal, written materials and models to describe Stable Angina and Peripheral Artery. Includes description of the disease process and treatment options available to the cardiac patient.   Exercise & Equipment Safety: - Individual verbal instruction and demonstration of equipment use and safety with use of the equipment.   Cardiac Rehab from 01/16/2018 in [facility] Cardiac and Pulmonary Rehab  Date  09/13/17  Educator  [facility]  Instruction Review Code  1- Verbalizes Understanding      Infection Prevention: - Provides verbal and written material to individual with discussion of infection control including proper hand washing and proper equipment cleaning during exercise session.   Cardiac Rehab from 01/16/2018 in [facility] Cardiac and Pulmonary Rehab  Date  09/13/17  Educator  [facility]  Instruction Review Code  1- Verbalizes Understanding      Falls Prevention: - Provides verbal and written material to individual with discussion of falls prevention and safety.   Cardiac Rehab from 01/16/2018 in [facility] Cardiac and Pulmonary Rehab  Date  09/13/17  Educator  [facility]  Instruction Review Code  1- Verbalizes Understanding      Diabetes: - Individual verbal and written instruction to review signs/symptoms of diabetes, desired ranges of glucose level fasting, after meals and with exercise. Acknowledge that pre and post exercise glucose checks will be done for 3 sessions at entry of program.   Cardiac Rehab from 01/16/2018 in [facility] Cardiac and Pulmonary Rehab  Date  09/13/17  Educator  [facility]  Instruction Review Code  1- Verbalizes Understanding      Know Your Numbers and Risk Factors: -Group verbal and written instruction about important numbers in your health.  Discussion of what are risk factors and how they play a role in the disease process.  Review of Cholesterol,  Blood Pressure, Diabetes, and BMI and the role they play in your overall health.   Cardiac Rehab from 01/16/2018 in [facility] Cardiac and Pulmonary Rehab  Date  01/07/18  Educator  CE  Instruction Review Code  1- Verbalizes Understanding      Sleep Hygiene: -Provides group verbal and written instruction about how sleep can affect your health.  Define sleep hygiene, discuss sleep cycles and impact of sleep habits. Review good sleep hygiene tips.    Cardiac Rehab from 01/16/2018 in [facility] Cardiac and Pulmonary Rehab  Date  12/19/17  Educator  [place]  Instruction Review Code  1- Verbalizes Understanding      Other: -Provides group and verbal instruction on various topics (see comments)   Knowledge Questionnaire Score: Knowledge Questionnaire Score - 09/13/17 1446      Knowledge Questionnaire Score   Pre Score  19/26   correct answers reviewed with Aman, focus on Exercise and Nutrition      Core Components/Risk Factors/Patient Goals at Admission: Personal Goals and Risk Factors at Admission - 09/13/17 1446      Core Components/Risk Factors/Patient Goals on Admission    Weight Management  Yes;Obesity;Weight Loss    Intervention  Weight Management: Develop a combined nutrition and exercise program designed to reach desired caloric intake, while maintaining appropriate intake of nutrient and fiber, sodium and fats, and appropriate energy expenditure required for the weight goal.;Weight Management: Provide education and appropriate resources to help participant work on and attain dietary goals.;Weight Management/Obesity: Establish reasonable short term and long term weight goals.;Obesity: Provide education and appropriate resources to help participant work on and attain dietary goals.    Admit Weight  312 lb (141.5 kg)    Goal Weight: Short Term  308 lb (139.7 kg)    Goal Weight: Long Term  270 lb (122.5 kg)    Expected Outcomes  Short Term: Continue to assess and modify interventions until  short term weight is achieved;Long Term: Adherence to nutrition and physical activity/exercise program aimed toward attainment of established weight goal;Weight Loss: Understanding of general recommendations for a balanced deficit meal plan, which promotes 1-2 lb weight loss per week and includes a negative energy balance of [phone] kcal/d;Understanding recommendations for meals to include 15-35% energy as protein, 25-35% energy from fat, 35-60% energy from carbohydrates, less than 243m of dietary cholesterol, 20-35 gm of total fiber daily;Understanding of distribution of calorie intake throughout the day with the consumption of 4-5 meals/snacks    Diabetes  Yes    Intervention  Provide education about signs/symptoms and action to take for hypo/hyperglycemia.;Provide education about proper nutrition, including hydration, and aerobic/resistive exercise prescription along with prescribed medications to achieve blood glucose in normal ranges: Fasting glucose 65-99 mg/dL    Expected Outcomes  Short Term: Participant verbalizes understanding of the signs/symptoms and immediate care of hyper/hypoglycemia, proper foot care and importance of medication, aerobic/resistive exercise and nutrition plan for blood glucose control.;Long Term: Attainment of HbA1C < 7%.    Heart Failure  Yes    Intervention  Provide a combined exercise and nutrition program that is supplemented with education, support and counseling about heart failure. Directed toward relieving symptoms such as shortness of breath, decreased exercise tolerance, and extremity edema.    Expected Outcomes  Improve functional capacity of life;Short term: Attendance in program 2-3 days a week with increased exercise capacity. Reported lower sodium intake. Reported increased fruit and vegetable intake. Reports medication compliance.;Short term: Daily weights obtained and reported for increase. Utilizing diuretic protocols set by physician.;Long term: Adoption of  self-care skills and reduction of barriers for early signs and symptoms recognition and intervention leading to self-care maintenance.    Hypertension  Yes    Intervention  Provide education on lifestyle modifcations including regular physical activity/exercise, weight management, moderate sodium restriction and increased consumption of fresh fruit, vegetables, and low fat dairy, alcohol moderation, and smoking cessation.;Monitor prescription use compliance.    Expected Outcomes  Short Term: Continued assessment and intervention until BP is < 140/953mHG in hypertensive participants. < 130/8035mG in hypertensive participants with  diabetes, heart failure or chronic kidney disease.;Long Term: Maintenance of blood pressure at goal levels.    Lipids  Yes    Intervention  Provide education and support for participant on nutrition & aerobic/resistive exercise along with prescribed medications to achieve LDL <53m, HDL >421m    Expected Outcomes  Short Term: Participant states understanding of desired cholesterol values and is compliant with medications prescribed. Participant is following exercise prescription and nutrition guidelines.;Long Term: Cholesterol controlled with medications as prescribed, with individualized exercise RX and with personalized nutrition plan. Value goals: LDL < 7072mHDL > 40 mg.       Core Components/Risk Factors/Patient Goals Review:  Goals and Risk Factor Review    Row Name 12/19/17 1551 01/17/18 1650           Core Components/Risk Factors/Patient Goals Review   Personal Goals Review  Lipids;Hypertension;Diabetes  Weight Management/Obesity;Improve shortness of breath with ADL's;Heart Failure;Hypertension;Lipids      Review  Jourden has been measuring BP and BG at home.  BG has been better since he started exercising.  He wants it to be below 150 fasting.  Willman states he is doing well with exercise - his legs are getting stronger and he looks forward to coming.  He is  taking meds as directed.  He wants to get to 270 lb.  He was 302 today  Demetrus is walking to and from entrance with his walker.  He also is up to 14 laps in class.  He has a couple blood tests next week for BG.  He has been having big fluctuations in BG.  He has had some stress due to recent death of his sister.  He would like to speak with counselor next week.      Expected Outcomes  Short - meet with RD about healthy dietary habits Long - achieve BG lower than 150, add walking to exercise Long - walk to and from elevator (without walker)   Short - follow through with tests for BG Long - be able to control BG better          Core Components/Risk Factors/Patient Goals at Discharge (Final Review):  Goals and Risk Factor Review - 01/17/18 1650      Core Components/Risk Factors/Patient Goals Review   Personal Goals Review  Weight Management/Obesity;Improve shortness of breath with ADL's;Heart Failure;Hypertension;Lipids    Review  Marvell is walking to and from entrance with his walker.  He also is up to 14 laps in class.  He has a couple blood tests next week for BG.  He has been having big fluctuations in BG.  He has had some stress due to recent death of his sister.  He would like to speak with counselor next week.    Expected Outcomes  Short - follow through with tests for BG Long - be able to control BG better        ITP Comments: ITP Comments    Row Name 09/13/17 1442 09/13/17 1453 09/19/17 0910 10/09/17 1554 10/17/17 0546   ITP Comments  Med Review completed. Initial ITP created. [facility]  Woodrow completed 2 min of the 6 min walk and needed to rest. While sitting, it was noted his heart rate was sustaining in 130s. SOB was relieved with rest and he thought it was because he usually gets SOB with movement. He rested in chair for 15 minutes and HR still maintained int he 130s. Patient brought  upstairs to ED to be evaluated.   30 day review  completed. ITP sent to Dr. Ramonita Lab, covering for Dr. Emily Filbert, Medical Director of Cardiac Rehab. Continue with ITP unless changes are made by physician.  new to program. Has not started exercise yet.   Dr. Doren Custard called to let us know that he has a follow up appointment with Deren next week.  Mr. Mena was just discharged last week from a gout flare up and had some SVT while admitted then.  Dr. Doren Custard is considering a stress test prior to releasing him to start rehab.  He will let us know when Mr. Neenan can start after his appointment next week.   30 day review completed. ITP sent to Dr. Emily Filbert, Medical Director of Cardiac Rehab. Continue with ITP unless changes are made by physician  HAs attended med review,out for medical concerns   [place] Name 10/24/17 1529 11/14/17 0652 12/05/17 0946 12/10/17 1726 12/12/17 0612   ITP Comments  Dr. Doren Custard called to update [place] on Mr. Alicea.  He was supposed to do a stress test yesterday, but present volume overloaded.  Dr. Doren Custard made some medication adjustements to try to optimize him.  He will have Mr. Walmer come back in 2 weeks for his stress test and will update [place] on his status after that.   30 day review.  Continue with ITP unless directed changes per Medical Director review.  Remians out for medical reasons  Received clearance for Azion to start rehab.  Left message on voice mail at home to call to schedule first day.    Patient came today with clearance from doctor to resume rehab. It has been 2 months since his initial walk test so 6 min walk test was repeated today. Patient also met with dietician and mental health counselor today.   30 day review. Continue with ITP unless direccted changes per Medical Director Chart Review.     Marion Name 01/09/18 0621 01/23/18 1529 01/29/18 1022 02/05/18 0651     ITP Comments  30 day review. Continue with ITP unless direccted changes per Medical Director Chart Review.  Mrs. Sato called to let us know that Treyvonne has  been sick since Monday afternoon.  He is very weak.   Tzion has been sick and missed class since last review.  30 Day Review. Continue with ITP unless directed changes per Medical Director review. 4 visits this month Last visit 12/12       Comments:

## 2018-02-07 ENCOUNTER — Encounter: Payer: No Typology Code available for payment source | Attending: Internal Medicine

## 2018-02-07 DIAGNOSIS — Z87891 Personal history of nicotine dependence: Secondary | ICD-10-CM | POA: Insufficient documentation

## 2018-02-07 DIAGNOSIS — I5022 Chronic systolic (congestive) heart failure: Secondary | ICD-10-CM | POA: Insufficient documentation

## 2018-02-11 ENCOUNTER — Telehealth: Payer: Self-pay | Admitting: Dietician

## 2018-02-11 ENCOUNTER — Telehealth: Payer: Self-pay

## 2018-02-11 NOTE — Telephone Encounter (Signed)
Trevor Jennings has not been able to walk since he had a colonoscopy.  He will return after he sees his Dr. And gets cleared.

## 2018-02-11 NOTE — Telephone Encounter (Signed)
Pt's wife called to inform rehab staff that Trevor Jennings has been unable to walk since December s/p colonoscopy and will return to their doctor Wednesday. He will not be in class this week

## 2018-02-25 ENCOUNTER — Telehealth: Payer: Self-pay

## 2018-02-25 NOTE — Telephone Encounter (Signed)
LMOM

## 2018-02-27 ENCOUNTER — Telehealth: Payer: Self-pay

## 2018-02-27 NOTE — Telephone Encounter (Signed)
Mailbox full cannot accept ,messages

## 2018-03-06 ENCOUNTER — Encounter: Payer: Self-pay | Admitting: *Deleted

## 2018-03-06 DIAGNOSIS — I5022 Chronic systolic (congestive) heart failure: Secondary | ICD-10-CM

## 2018-03-06 NOTE — Progress Notes (Signed)
Cardiac Individual Treatment Plan  Patient Details  Name: Trevor Jennings MRN: [id number] Date of Birth: July 23, 1940 Referring Provider:     Cardiac Rehab from 09/13/2017 in [facility]  [facility]      Initial Encounter Date:    Cardiac Rehab from 09/13/2017 in [facility] Cardiac and Pulmonary Rehab  Date  09/13/17      Visit Diagnosis: Heart failure, chronic systolic ([place])  Patient's Home Medications on Admission:  Current Outpatient Medications:  .  albuterol (PROVENTIL HFA;VENTOLIN HFA) 108 (90 Base) MCG/ACT inhaler, Inhale into the lungs., Disp: , Rfl:  .  aspirin EC 81 MG tablet, Take by mouth., Disp: , Rfl:  .  atorvastatin (LIPITOR) 80 MG tablet, Take by mouth., Disp: , Rfl:  .  carvedilol (COREG) 25 MG tablet, Take by mouth., Disp: , Rfl:  .  Cholecalciferol (VITAMIN D-1000 MAX ST) 1000 units tablet, Take by mouth., Disp: , Rfl:  .  citalopram (CELEXA) 40 MG tablet, Take by mouth., Disp: , Rfl:  .  fluticasone (FLONASE) 50 MCG/ACT nasal spray, 2 sprays by Each Nare route daily., Disp: , Rfl:  .  gabapentin (NEURONTIN) 100 MG capsule, Take by mouth., Disp: , Rfl:  .  insulin glargine (LANTUS) 100 UNIT/ML injection, Inject into the skin., Disp: , Rfl:  .  insulin regular (NOVOLIN R,HUMULIN R) 100 units/mL injection, Inject into the skin., Disp: , Rfl:  .  loratadine (CLARITIN) 10 MG tablet, Take 10 mg by mouth daily., Disp: , Rfl:  .  pantoprazole (PROTONIX) 40 MG tablet, Take by mouth., Disp: , Rfl:  .  torsemide (DEMADEX) 20 MG tablet, Take by mouth., Disp: , Rfl:   Past Medical History: No past medical history on file.  Tobacco Use: Social History   Tobacco Use  Smoking Status Former Smoker  . Last attempt to quit: 1995  . Years since quitting: 25.0  Smokeless Tobacco Never Used    Labs: Recent Review Flowsheet Data    There is no flowsheet data to display.       Exercise Target Goals: Exercise Program Goal: Individual exercise  prescription set using results from initial 6 min walk test and THRR while considering  patient's activity barriers and safety.   Exercise Prescription Goal: Initial exercise prescription builds to 30-45 minutes a day of aerobic activity, 2-3 days per week.  Home exercise guidelines will be given to patient during program as part of exercise prescription that the participant will acknowledge.  Activity Barriers & Risk Stratification: Activity Barriers & Cardiac Risk Stratification - 09/13/17 1455      Activity Barriers & Cardiac Risk Stratification   Activity Barriers  Back Problems;Shortness of Breath    Cardiac Risk Stratification  High       6 Minute Walk: 6 Minute Walk    Row Name 09/13/17 1426 12/10/17 1646       6 Minute Walk   Phase  -  Initial    Distance  200 feet  380 feet    Walk Time  2 minutes  2.75 minutes    # of Rest Breaks  -  3    MPH  1.13  0.72    RPE  13  13    Perceived Dyspnea   2  -    Symptoms  Yes (comment)  Yes (comment)    Comments  short of breath - stopped at 2:00   shortness of breath and tiredness    Resting HR  88 bpm  64 bpm    Resting BP  110/64  110/70    Resting Oxygen Saturation   94 %  99 %    Exercise Oxygen Saturation  during 6 min walk  97 %  100 %    Max Ex. HR  144 bpm  77 bpm    Max Ex. BP  124/60  140/70    2 Minute Post BP  110/56  140/70       Oxygen Initial Assessment:   Oxygen Re-Evaluation:   Oxygen Discharge (Final Oxygen Re-Evaluation):   Initial Exercise Prescription: Initial Exercise Prescription - 09/13/17 1400      Date of Initial Exercise RX and Referring Provider   Date  09/13/17    [facility]      Treadmill   MPH  0.8    Grade  0    Minutes  15   rest as needed   METs  1      T5 Nustep   Level  1    SPM  60    Minutes  15      Biostep-RELP   Level  1    SPM  50    Minutes  15    METs  1      Track   Laps  10    Minutes  15   rest as needed   METs  1      Prescription  Details   Frequency (times per week)  3    Duration  Progress to 45 minutes of aerobic exercise without signs/symptoms of physical distress      Intensity   THRR 40-80% of Max Heartrate  110-132    Ratings of Perceived Exertion  11-13    Perceived Dyspnea  0-4      Resistance Training   Training Prescription  Yes    Weight  3 lb    Reps  10-15       Perform Capillary Blood Glucose checks as needed.  Exercise Prescription Changes: Exercise Prescription Changes    Row Name 09/13/17 1400 12/10/17 1600 12/19/17 1100 12/27/17 1600 01/01/18 0800     Response to Exercise   Blood Pressure (Admit)  110/64  110/70  120/66  -  138/64   Blood Pressure (Exercise)  124/60  140/70  136/60  -  126/66   Blood Pressure (Exit)  110/56  104/70  130/82  -  -   Heart Rate (Admit)  81 bpm  64 bpm  95 bpm  -  70 bpm   Heart Rate (Exercise)  144 bpm  77 bpm  117 bpm  -  103 bpm   Heart Rate (Exit)  133 bpm  65 bpm  67 bpm  -  73 bpm   Oxygen Saturation (Admit)  96 %  99 %  -  -  -   Oxygen Saturation (Exercise)  98 %  100 %  -  -  -   Oxygen Saturation (Exit)  97 %  100 %  -  -  -   Rating of Perceived Exertion (Exercise)  13  13  15   -  11   Perceived Dyspnea (Exercise)  2  -  -  -  -   Symptoms  -  -  -  -  did not feel PVCs   Duration  -  -  Progress to 45 minutes of aerobic exercise without signs/symptoms of physical distress  Progress to 45 minutes of aerobic exercise without signs/symptoms of physical distress  Progress to 45 minutes of aerobic exercise without signs/symptoms of physical distress   Intensity  -  -  THRR unchanged  THRR unchanged  Other (comment) pacemaker     Progression   Progression  -  -  Continue to progress workloads to maintain intensity without signs/symptoms of physical distress.  Continue to progress workloads to maintain intensity without signs/symptoms of physical distress.  Continue to progress workloads to maintain intensity without signs/symptoms of physical  distress.   Average METs  -  -  2.1  2.1  2     Resistance Training   Training Prescription  -  Yes  Yes  Yes  Yes   Weight  -  3 lb  3 lb  3 lb  3 lb   Reps  -  10-15  10-15  10-15  10-15     Interval Training   Interval Training  -  -  No  No  No     Treadmill   MPH  -  0.8  -  -  -   Grade  -  0  -  -  -   Minutes  -  15 rest as needed  -  -  -   METs  -  1  -  -  -     T5 Nustep   Level  -  1  1  1  1    SPM  -  60  60  60  60   Minutes  -  15  15  15  15      Biostep-RELP   Level  -  1  1  1   -   SPM  -  50  50  50  -   Minutes  -  15  15  15   -   METs  -  1  2  2   -     Track   Laps  -  10  -  -  12   Minutes  -  15 rest as needed  -  -  -   METs  -  1  -  -  1.55     Home Exercise Plan   Plans to continue exercise at  -  -  -  Home (comment) exercise chair with DVD's at home, and walking  Home (comment) exercise chair with DVD's at home, and walking   Frequency  -  -  -  Add 2 additional days to program exercise sessions.  Add 2 additional days to program exercise sessions.   Initial Home Exercises Provided  -  -  -  12/27/17  12/27/17   Row Name 01/17/18 0800             Response to Exercise   Blood Pressure (Admit)  142/80       Blood Pressure (Exercise)  134/58       Blood Pressure (Exit)  122/70       Heart Rate (Admit)  74 bpm       Heart Rate (Exercise)  97 bpm       Heart Rate (Exit)  79 bpm       Rating of Perceived Exertion (Exercise)  13       Symptoms  none       Duration  Progress to 45 minutes of aerobic exercise without signs/symptoms of physical distress  Intensity  Other (comment) pacemaker         Progression   Progression  Continue to progress workloads to maintain intensity without signs/symptoms of physical distress.         Resistance Training   Training Prescription  Yes       Weight  3 lb       Reps  10-15         Interval Training   Interval Training  No         Treadmill   MPH  0.8       Grade  0         T5  Nustep   Level  1       SPM  60       Minutes  15         Track   Laps  14       METs  1.7         Home Exercise Plan   Plans to continue exercise at  Home (comment) exercise chair with DVD's at home, and walking       Frequency  Add 2 additional days to program exercise sessions.       Initial Home Exercises Provided  12/27/17          Exercise Comments:   Exercise Goals and Review: Exercise Goals    Row Name 09/13/17 1425             Exercise Goals   Increase Physical Activity  Yes       Intervention  Provide advice, education, support and counseling about physical activity/exercise needs.;Develop an individualized exercise prescription for aerobic and resistive training based on initial evaluation findings, risk stratification, comorbidities and participant's personal goals.       Expected Outcomes  Short Term: Attend rehab on a regular basis to increase amount of physical activity.;Long Term: Add in home exercise to make exercise part of routine and to increase amount of physical activity.;Long Term: Exercising regularly at least 3-5 days a week.       Increase Strength and Stamina  Yes       Intervention  Provide advice, education, support and counseling about physical activity/exercise needs.;Develop an individualized exercise prescription for aerobic and resistive training based on initial evaluation findings, risk stratification, comorbidities and participant's personal goals.       Expected Outcomes  Short Term: Increase workloads from initial exercise prescription for resistance, speed, and METs.;Short Term: Perform resistance training exercises routinely during rehab and add in resistance training at home;Long Term: Improve cardiorespiratory fitness, muscular endurance and strength as measured by increased METs and functional capacity (6MWT)       Able to understand and use rate of perceived exertion (RPE) scale  Yes       Intervention  Provide education and explanation  on how to use RPE scale       Expected Outcomes  Short Term: Able to use RPE daily in rehab to express subjective intensity level;Long Term:  Able to use RPE to guide intensity level when exercising independently       Able to understand and use Dyspnea scale  Yes       Intervention  Provide education and explanation on how to use Dyspnea scale       Expected Outcomes  Short Term: Able to use Dyspnea scale daily in rehab to express subjective sense of shortness of breath during exertion;Long Term: Able to use Dyspnea  scale to guide intensity level when exercising independently       Knowledge and understanding of Target Heart Rate Range (THRR)  Yes       Intervention  Provide education and explanation of THRR including how the numbers were predicted and where they are located for reference       Expected Outcomes  Short Term: Able to state/look up THRR;Long Term: Able to use THRR to govern intensity when exercising independently;Short Term: Able to use daily as guideline for intensity in rehab       Able to check pulse independently  Yes       Intervention  Provide education and demonstration on how to check pulse in carotid and radial arteries.;Review the importance of being able to check your own pulse for safety during independent exercise       Expected Outcomes  Short Term: Able to explain why pulse checking is important during independent exercise;Long Term: Able to check pulse independently and accurately       Understanding of Exercise Prescription  Yes       Intervention  Provide education, explanation, and written materials on patient's individual exercise prescription       Expected Outcomes  Short Term: Able to explain program exercise prescription;Long Term: Able to explain home exercise prescription to exercise independently          Exercise Goals Re-Evaluation : Exercise Goals Re-Evaluation    Row Name 12/19/17 1154 12/27/17 1659 01/01/18 0824 01/17/18 0831       Exercise Goal  Re-Evaluation   Exercise Goals Review  Increase Physical Activity;Increase Strength and Stamina;Able to understand and use rate of perceived exertion (RPE) scale  Increase Physical Activity;Increase Strength and Stamina;Able to understand and use rate of perceived exertion (RPE) scale;Knowledge and understanding of Target Heart Rate Range (THRR);Understanding of Exercise Prescription;Able to check pulse independently  Increase Physical Activity;Increase Strength and Stamina;Able to understand and use rate of perceived exertion (RPE) scale;Knowledge and understanding of Target Heart Rate Range (THRR);Understanding of Exercise Prescription  Increase Physical Activity;Increase Strength and Stamina;Able to understand and use rate of perceived exertion (RPE) scale    Comments  Theotis makes good effort when he exercises.  Staff will encourage him to start walking this week.  Reviewed home exercise with pt today.  Pt plans to use exercise chair with DVD's and walk at home for exercise.  Reviewed THR, pulse, RPE, sign and symptoms, NTG use, and when to call 911 or MD.  Also discussed weather considerations and indoor options.  Pt voiced understanding.  Jaccob was able to walk all the way from elevator to gym without rest.  He has better ROM for flexibility exercises.    Alisha continues to show improvement with endurance.  He walked 14 laps last session and tried the TM.  Staff will monitor progressions    Expected Outcomes  Short - add walking to exercise Long - increase overall MET level  Short: add 1-2 days of exercise at home on off days of cardiac rehab class. Long: Become indepenedent with exericse routine.   Short - walk to and from elevator and gym  Long - walk without walker   Short - 15 laps without stopping walking Long - increase overall endurance        Discharge Exercise Prescription (Final Exercise Prescription Changes): Exercise Prescription Changes - 01/17/18 0800      Response to Exercise    Blood Pressure (Admit)  142/80    Blood Pressure (  Exercise)  134/58    Blood Pressure (Exit)  122/70    Heart Rate (Admit)  74 bpm    Heart Rate (Exercise)  97 bpm    Heart Rate (Exit)  79 bpm    Rating of Perceived Exertion (Exercise)  13    Symptoms  none    Duration  Progress to 45 minutes of aerobic exercise without signs/symptoms of physical distress    Intensity  Other (comment)   pacemaker     Progression   Progression  Continue to progress workloads to maintain intensity without signs/symptoms of physical distress.      Resistance Training   Training Prescription  Yes    Weight  3 lb    Reps  10-15      Interval Training   Interval Training  No      Treadmill   MPH  0.8    Grade  0      T5 Nustep   Level  1    SPM  60    Minutes  15      Track   Laps  14    METs  1.7      Home Exercise Plan   Plans to continue exercise at  Home (comment)   exercise chair with DVD's at home, and walking   Frequency  Add 2 additional days to program exercise sessions.    Initial Home Exercises Provided  12/27/17       Nutrition:  Target Goals: Understanding of nutrition guidelines, daily intake of sodium <1565m, cholesterol <2055m calories 30% from fat and 7% or less from saturated fats, daily to have 5 or more servings of fruits and vegetables.  Biometrics: Pre Biometrics - 12/10/17 1651      Pre Biometrics   Height  5' 10"  (1.778 m)    Weight  (!) 310 lb 6.4 oz (140.8 kg)    Waist Circumference  55 inches    Hip Circumference  54 inches    Waist to Hip Ratio  1.02 %    BMI (Calculated)  44.54        Nutrition Therapy Plan and Nutrition Goals: Nutrition Therapy & Goals - 12/12/17 0941      Nutrition Therapy   Diet  DM    Drug/Food Interactions  Statins/Certain Fruits    Protein (specify units)  12oz    Fiber  35 grams    Whole Grain Foods  3 servings   chooses some whole grains   Saturated Fats  16 max. grams    Fruits and Vegetables  6 servings/day    8 ideal; typically eats 1 serving of fruit per day   Sodium  1500 grams      Personal Nutrition Goals   Nutrition Goal  Add a protien source at breakfast and snacks more often to help better manage blood sugars.    Personal Goal #2  Eat less foods that contain added sugar IE doughnuts, cookies and candy. Instead, trial the new snack options you recently purchased such as yogurt, nuts and cheese    Personal Goal #3  Try not to skip meals during the day. If you are not hungry for lunch, at least add a protein source with your typical fruit    Comments  Pt reports his BG readings to be varying more than normal lately. Recent BG readings range from 165-190. Breakfast: oatmeal, cereal like Raisin Bran, eggs and 2 slices bacon, or doughnuts occasionally; Lunch: may skip and  have a snack of fruit; Dinner: pork chops, hamburgers, pot pie, cabbage, chicken, broccoli, other vegetable varieties. They eat meals mostly at home and his wife does not cook with salt. He drinks water and coffee. They try to "watch" their starch intake in addition to salt. Per August 2019 labs sodium was below normal limits.       Intervention Plan   Intervention  Prescribe, educate and counsel regarding individualized specific dietary modifications aiming towards targeted core components such as weight, hypertension, lipid management, diabetes, heart failure and other comorbidities.    Expected Outcomes  Long Term Goal: Adherence to prescribed nutrition plan.;Short Term Goal: A plan has been developed with personal nutrition goals set during dietitian appointment.;Short Term Goal: Understand basic principles of dietary content, such as calories, fat, sodium, cholesterol and nutrients.       Nutrition Assessments: Nutrition Assessments - 09/13/17 1445      MEDFICTS Scores   Pre Score  58       Nutrition Goals Re-Evaluation: Nutrition Goals Re-Evaluation    Row Name 12/12/17 1047 12/19/17 1732           Goals    Nutrition Goal  Eat less foods that contain added sugar IE doughnuts, cookies and candy. Instead, trial the new snack options you recently purchased such as yogurt, nuts and cheese  Add a protein source at breakfast and snacks more often to help better manage blood sugars; Eat less foods that contain added sugars and instead trial new/lower sugar snack options; Try not to skip meals during the day- if you are not hungry mid-day at least have a snack, ideally that contains both CHO and PRO      Comment  He keeps candy on him on most days that he uses both as a snack and to treat hypoglycemia. He and his wife split half a dozen doughnuts on occasion for breakfast. Per wife, they recently purchased different snack options to try that are more nutrient-dense which they plan to trial this week  He has been working with his wife to use less salt and consume less added sugar foods. He is learning over time to practice better portion control and eat foods he enjoys in moderation. For example, rather than 6 doughnuts a day he has made it a goal to have 1 doughnut every month, and is also learning to be satisfied with one cooking rather than several as a snack. He trialed the yogurt as a snack and enjoys it. At breakfast he has been including eggs occasionally for a protein source      Expected Outcome  He will eat less foods that contain added sugars and replace these with foods that have natural sugars and/or that have more nutrient density  He will continue to practice portion control and moderation and learn what balanced eating means for himself. He will be more consistent about adding a protein source at meals / snacks and to choose lower sugar items overall        Personal Goal #2 Re-Evaluation   Personal Goal #2  Add a protein source at breakfast and snacks more often to better manage blood sugars  -        Personal Goal #3 Re-Evaluation   Personal Goal #3  Try not to skip meals during the day. If you are not  hungry for lunch, at least add a protein source with your typical fruit  -         Nutrition Goals  Discharge (Final Nutrition Goals Re-Evaluation): Nutrition Goals Re-Evaluation - 12/19/17 1732      Goals   Nutrition Goal  Add a protein source at breakfast and snacks more often to help better manage blood sugars; Eat less foods that contain added sugars and instead trial new/lower sugar snack options; Try not to skip meals during the day- if you are not hungry mid-day at least have a snack, ideally that contains both CHO and PRO    Comment  He has been working with his wife to use less salt and consume less added sugar foods. He is learning over time to practice better portion control and eat foods he enjoys in moderation. For example, rather than 6 doughnuts a day he has made it a goal to have 1 doughnut every month, and is also learning to be satisfied with one cooking rather than several as a snack. He trialed the yogurt as a snack and enjoys it. At breakfast he has been including eggs occasionally for a protein source    Expected Outcome  He will continue to practice portion control and moderation and learn what balanced eating means for himself. He will be more consistent about adding a protein source at meals / snacks and to choose lower sugar items overall       Psychosocial: Target Goals: Acknowledge presence or absence of significant depression and/or stress, maximize coping skills, provide positive support system. Participant is able to verbalize types and ability to use techniques and skills needed for reducing stress and depression.   Initial Review & Psychosocial Screening: Initial Psych Review & Screening - 09/13/17 1443      Initial Review   Current issues with  Current Stress Concerns    Source of Stress Concerns  Chronic Illness;Unable to perform yard/household activities;Unable to participate in former interests or hobbies      [place]?  Yes    wife     Barriers   Psychosocial barriers to participate in program  There are no identifiable barriers or psychosocial needs.;The patient should benefit from training in stress management and relaxation.      Screening Interventions   Interventions  Encouraged to exercise;Program counselor consult;To provide support and resources with identified psychosocial needs;Provide feedback about the scores to participant    Expected Outcomes  Short Term goal: Utilizing psychosocial counselor, staff and physician to assist with identification of specific Stressors or current issues interfering with healing process. Setting desired goal for each stressor or current issue identified.;Long Term Goal: Stressors or current issues are controlled or eliminated.;Short Term goal: Identification and review with participant of any Quality of Life or Depression concerns found by scoring the questionnaire.;Long Term goal: The participant improves quality of Life and PHQ9 Scores as seen by post scores and/or verbalization of changes       Quality of Life Scores:  Quality of Life - 09/13/17 1444      Quality of Life   Select  Quality of Life      Quality of Life Scores   Health/Function Pre  12.29 %    Socioeconomic Pre  19.83 %    Psych/Spiritual Pre  25.57 %    Family Pre  19.3 %    GLOBAL Pre  17.7 %      Scores of 19 and below usually indicate a poorer quality of life in these areas.  A difference of  2-3 points is a clinically meaningful difference.  A difference of  2-3 points in the total score of the Quality of Life Index has been associated with significant improvement in overall quality of life, self-image, physical symptoms, and general health in studies assessing change in quality of life.  PHQ-9: Recent Review Flowsheet Data    Depression screen [facility] 2/9 12/10/2017 09/13/2017   Decreased Interest 0 2   Down, Depressed, Hopeless 2 2   PHQ - 2 Score 2 4   Altered sleeping 1 2   Tired, decreased  energy 3 3   Change in appetite 1 0   Feeling bad or failure about yourself  2 0   Trouble concentrating 3 2   Moving slowly or fidgety/restless 3 0   Suicidal thoughts 0 0   PHQ-9 Score 15 11   Difficult doing work/chores Not difficult at all Somewhat difficult     Interpretation of Total Score  Total Score Depression Severity:  1-4 = Minimal depression, 5-9 = Mild depression, 10-14 = Moderate depression, 15-19 = Moderately severe depression, 20-27 = Severe depression   Psychosocial Evaluation and Intervention: Psychosocial Evaluation - 12/10/17 1739      Psychosocial Evaluation & Interventions   Interventions  Stress management education;Encouraged to exercise with the program and follow exercise prescription    Comments  Counselor met with Mr. Mckesson [facility]) accompanied by his spouse - Everlene Farrier today for an initial psychsocial evaluation.  He is a 78 year old who has COPD and has been in and out of the hospital since August - due to falling;dehydration;and low potassium.  He also struggles with diabetes and HBP.  Masud has a strong support system with his spouse, his daughter in [place]; & spouse's extensive family locally.  He sleeps well with the use of a CPAP and has a good appetite.  Lynkin reports a history of depression and is on medication to help with this. He states he is typically in a good mood but his spouse reports this being up and down at times with irritability on occasion.  His goals for this program are to feel stronger; have more energy; be able to walk and be more flexible to tie his own shoes.  He would like to lose some weight also if possible.  Counselor informed Vinson of his PHQ-9 scores of 15 -indicating moderately severe symptoms of depression with feeling down or depressed and feeling bad about himself in his current health condition.  Ammaar also reported his sister passed away  yesterday and this could impact his scores/mood today.  Counselor encouraged Zarif to  attend and exercise consistently; to find ways to manage his stress and have support in his grief at this time.  Counselor will follow with him.     Expected Outcomes  Short:  Cristin will attend and exercise consistently for his health and mental health.  Kaden will participate in the psychoeducational components of this program - including stress and depression to learn positive coping strategies.  Long:  Mar will develop a routine of positive self-care for his health and mental health.    Continue Psychosocial Services   Follow up required by counselor       Psychosocial Re-Evaluation:   Psychosocial Discharge (Final Psychosocial Re-Evaluation):   Vocational Rehabilitation: Provide vocational rehab assistance to qualifying candidates.   Vocational Rehab Evaluation & Intervention: Vocational Rehab - 09/13/17 1446      Initial Vocational Rehab Evaluation & Intervention   Assessment shows need for Vocational Rehabilitation  No       Education: Education  Goals: Education classes will be provided on a variety of topics geared toward better understanding of heart health and risk factor modification. Participant will state understanding/return demonstration of topics presented as noted by education test scores.  Learning Barriers/Preferences: Learning Barriers/Preferences - 09/13/17 1445      Learning Barriers/Preferences   Learning Barriers  Hearing    Learning Preferences  Computer/Internet;Individual Instruction       Education Topics:  AED/CPR: - Group verbal and written instruction with the use of models to demonstrate the basic use of the AED with the basic ABC's of resuscitation.   General Nutrition Guidelines/Fats and Fiber: -Group instruction provided by verbal, written material, models and posters to present the general guidelines for heart healthy nutrition. Gives an explanation and review of dietary fats and fiber.   Controlling Sodium/Reading Food  Labels: -Group verbal and written material supporting the discussion of sodium use in heart healthy nutrition. Review and explanation with models, verbal and written materials for utilization of the food label.   Cardiac Rehab from 01/16/2018 in [facility] Cardiac and Pulmonary Rehab  Date  12/12/17  Educator  LB  Instruction Review Code  1- Verbalizes Understanding      Exercise Physiology & General Exercise Guidelines: - Group verbal and written instruction with models to review the exercise physiology of the cardiovascular system and associated critical values. Provides general exercise guidelines with specific guidelines to those with heart or lung disease.    Aerobic Exercise & Resistance Training: - Gives group verbal and written instruction on the various components of exercise. Focuses on aerobic and resistive training programs and the benefits of this training and how to safely progress through these programs..   Cardiac Rehab from 01/16/2018 in [facility] Cardiac and Pulmonary Rehab  Date  12/26/17  Educator  Nada Maclachlan, EP  Instruction Review Code  2- Demonstrated Understanding      Flexibility, Balance, Mind/Body Relaxation: Provides group verbal/written instruction on the benefits of flexibility and balance training, including mind/body exercise modes such as yoga, pilates and tai chi.  Demonstration and skill practice provided.   Cardiac Rehab from 01/16/2018 in [facility] Cardiac and Pulmonary Rehab  Date  12/31/17  Educator  AS  Instruction Review Code  1- Verbalizes Understanding      Stress and Anxiety: - Provides group verbal and written instruction about the health risks of elevated stress and causes of high stress.  Discuss the correlation between heart/lung disease and anxiety and treatment options. Review healthy ways to manage with stress and anxiety.   Depression: - Provides group verbal and written instruction on the correlation between heart/lung disease and depressed  mood, treatment options, and the stigmas associated with seeking treatment.   Cardiac Rehab from 01/16/2018 in [facility] Cardiac and Pulmonary Rehab  Date  01/16/18  Educator  [facility]  Instruction Review Code  1- Verbalizes Understanding      Anatomy & Physiology of the Heart: - Group verbal and written instruction and models provide basic cardiac anatomy and physiology, with the coronary electrical and arterial systems. Review of Valvular disease and Heart Failure   Cardiac Procedures: - Group verbal and written instruction to review commonly prescribed medications for heart disease. Reviews the medication, class of the drug, and side effects. Includes the steps to properly store meds and maintain the prescription regimen. (beta blockers and nitrates)   Cardiac Rehab from 01/16/2018 in [facility] Cardiac and Pulmonary Rehab  Date  01/09/18  Educator  [facility]  Instruction Review Code  1- [employer]  Understanding      Cardiac Medications I: - Group verbal and written instruction to review commonly prescribed medications for heart disease. Reviews the medication, class of the drug, and side effects. Includes the steps to properly store meds and maintain the prescription regimen.   Cardiac Medications II: -Group verbal and written instruction to review commonly prescribed medications for heart disease. Reviews the medication, class of the drug, and side effects. (all other drug classes)   Cardiac Rehab from 01/16/2018 in [facility] Cardiac and Pulmonary Rehab  Date  01/07/18  Educator  CE  Instruction Review Code  1- Verbalizes Understanding       Go Sex-Intimacy & Heart Disease, Get SMART - Goal Setting: - Group verbal and written instruction through game format to discuss heart disease and the return to sexual intimacy. Provides group verbal and written material to discuss and apply goal setting through the application of the S.M.A.R.T. Method.   Cardiac Rehab from 01/16/2018 in [facility] Cardiac and Pulmonary  Rehab  Date  01/09/18  Educator  [facility]  Instruction Review Code  1- Verbalizes Understanding      Other Matters of the Heart: - Provides group verbal, written materials and models to describe Stable Angina and Peripheral Artery. Includes description of the disease process and treatment options available to the cardiac patient.   Exercise & Equipment Safety: - Individual verbal instruction and demonstration of equipment use and safety with use of the equipment.   Cardiac Rehab from 01/16/2018 in [facility] Cardiac and Pulmonary Rehab  Date  09/13/17  Educator  [facility]  Instruction Review Code  1- Verbalizes Understanding      Infection Prevention: - Provides verbal and written material to individual with discussion of infection control including proper hand washing and proper equipment cleaning during exercise session.   Cardiac Rehab from 01/16/2018 in [facility] Cardiac and Pulmonary Rehab  Date  09/13/17  Educator  [facility]  Instruction Review Code  1- Verbalizes Understanding      Falls Prevention: - Provides verbal and written material to individual with discussion of falls prevention and safety.   Cardiac Rehab from 01/16/2018 in [facility] Cardiac and Pulmonary Rehab  Date  09/13/17  Educator  [facility]  Instruction Review Code  1- Verbalizes Understanding      Diabetes: - Individual verbal and written instruction to review signs/symptoms of diabetes, desired ranges of glucose level fasting, after meals and with exercise. Acknowledge that pre and post exercise glucose checks will be done for 3 sessions at entry of program.   Cardiac Rehab from 01/16/2018 in [facility] Cardiac and Pulmonary Rehab  Date  09/13/17  Educator  [facility]  Instruction Review Code  1- Verbalizes Understanding      Know Your Numbers and Risk Factors: -Group verbal and written instruction about important numbers in your health.  Discussion of what are risk factors and how they play a role in the disease process.  Review of Cholesterol,  Blood Pressure, Diabetes, and BMI and the role they play in your overall health.   Cardiac Rehab from 01/16/2018 in [facility] Cardiac and Pulmonary Rehab  Date  01/07/18  Educator  CE  Instruction Review Code  1- Verbalizes Understanding      Sleep Hygiene: -Provides group verbal and written instruction about how sleep can affect your health.  Define sleep hygiene, discuss sleep cycles and impact of sleep habits. Review good sleep hygiene tips.    Cardiac Rehab from 01/16/2018 in [facility] Cardiac and Pulmonary Rehab  Date  12/19/17  Educator  [place]  Instruction Review Code  1- Verbalizes Understanding      Other: -Provides group and verbal instruction on various topics (see comments)   Knowledge Questionnaire Score: Knowledge Questionnaire Score - 09/13/17 1446      Knowledge Questionnaire Score   Pre Score  19/26   correct answers reviewed with Jarris, focus on Exercise and Nutrition      Core Components/Risk Factors/Patient Goals at Admission: Personal Goals and Risk Factors at Admission - 09/13/17 1446      Core Components/Risk Factors/Patient Goals on Admission    Weight Management  Yes;Obesity;Weight Loss    Intervention  Weight Management: Develop a combined nutrition and exercise program designed to reach desired caloric intake, while maintaining appropriate intake of nutrient and fiber, sodium and fats, and appropriate energy expenditure required for the weight goal.;Weight Management: Provide education and appropriate resources to help participant work on and attain dietary goals.;Weight Management/Obesity: Establish reasonable short term and long term weight goals.;Obesity: Provide education and appropriate resources to help participant work on and attain dietary goals.    Admit Weight  312 lb (141.5 kg)    Goal Weight: Short Term  308 lb (139.7 kg)    Goal Weight: Long Term  270 lb (122.5 kg)    Expected Outcomes  Short Term: Continue to assess and modify interventions until  short term weight is achieved;Long Term: Adherence to nutrition and physical activity/exercise program aimed toward attainment of established weight goal;Weight Loss: Understanding of general recommendations for a balanced deficit meal plan, which promotes 1-2 lb weight loss per week and includes a negative energy balance of [phone] kcal/d;Understanding recommendations for meals to include 15-35% energy as protein, 25-35% energy from fat, 35-60% energy from carbohydrates, less than 255m of dietary cholesterol, 20-35 gm of total fiber daily;Understanding of distribution of calorie intake throughout the day with the consumption of 4-5 meals/snacks    Diabetes  Yes    Intervention  Provide education about signs/symptoms and action to take for hypo/hyperglycemia.;Provide education about proper nutrition, including hydration, and aerobic/resistive exercise prescription along with prescribed medications to achieve blood glucose in normal ranges: Fasting glucose 65-99 mg/dL    Expected Outcomes  Short Term: Participant verbalizes understanding of the signs/symptoms and immediate care of hyper/hypoglycemia, proper foot care and importance of medication, aerobic/resistive exercise and nutrition plan for blood glucose control.;Long Term: Attainment of HbA1C < 7%.    Heart Failure  Yes    Intervention  Provide a combined exercise and nutrition program that is supplemented with education, support and counseling about heart failure. Directed toward relieving symptoms such as shortness of breath, decreased exercise tolerance, and extremity edema.    Expected Outcomes  Improve functional capacity of life;Short term: Attendance in program 2-3 days a week with increased exercise capacity. Reported lower sodium intake. Reported increased fruit and vegetable intake. Reports medication compliance.;Short term: Daily weights obtained and reported for increase. Utilizing diuretic protocols set by physician.;Long term: Adoption of  self-care skills and reduction of barriers for early signs and symptoms recognition and intervention leading to self-care maintenance.    Hypertension  Yes    Intervention  Provide education on lifestyle modifcations including regular physical activity/exercise, weight management, moderate sodium restriction and increased consumption of fresh fruit, vegetables, and low fat dairy, alcohol moderation, and smoking cessation.;Monitor prescription use compliance.    Expected Outcomes  Short Term: Continued assessment and intervention until BP is < 140/928mHG in hypertensive participants. < 130/8051mG in hypertensive participants with  diabetes, heart failure or chronic kidney disease.;Long Term: Maintenance of blood pressure at goal levels.    Lipids  Yes    Intervention  Provide education and support for participant on nutrition & aerobic/resistive exercise along with prescribed medications to achieve LDL <91m, HDL >429m    Expected Outcomes  Short Term: Participant states understanding of desired cholesterol values and is compliant with medications prescribed. Participant is following exercise prescription and nutrition guidelines.;Long Term: Cholesterol controlled with medications as prescribed, with individualized exercise RX and with personalized nutrition plan. Value goals: LDL < 7041mHDL > 40 mg.       Core Components/Risk Factors/Patient Goals Review:  Goals and Risk Factor Review    Row Name 12/19/17 1551 01/17/18 1650           Core Components/Risk Factors/Patient Goals Review   Personal Goals Review  Lipids;Hypertension;Diabetes  Weight Management/Obesity;Improve shortness of breath with ADL's;Heart Failure;Hypertension;Lipids      Review  Miquel has been measuring BP and BG at home.  BG has been better since he started exercising.  He wants it to be below 150 fasting.  Jesiel states he is doing well with exercise - his legs are getting stronger and he looks forward to coming.  He is  taking meds as directed.  He wants to get to 270 lb.  He was 302 today  Branton is walking to and from entrance with his walker.  He also is up to 14 laps in class.  He has a couple blood tests next week for BG.  He has been having big fluctuations in BG.  He has had some stress due to recent death of his sister.  He would like to speak with counselor next week.      Expected Outcomes  Short - meet with RD about healthy dietary habits Long - achieve BG lower than 150, add walking to exercise Long - walk to and from elevator (without walker)   Short - follow through with tests for BG Long - be able to control BG better          Core Components/Risk Factors/Patient Goals at Discharge (Final Review):  Goals and Risk Factor Review - 01/17/18 1650      Core Components/Risk Factors/Patient Goals Review   Personal Goals Review  Weight Management/Obesity;Improve shortness of breath with ADL's;Heart Failure;Hypertension;Lipids    Review  Nahum is walking to and from entrance with his walker.  He also is up to 14 laps in class.  He has a couple blood tests next week for BG.  He has been having big fluctuations in BG.  He has had some stress due to recent death of his sister.  He would like to speak with counselor next week.    Expected Outcomes  Short - follow through with tests for BG Long - be able to control BG better        ITP Comments: ITP Comments    Row Name 09/13/17 1442 09/13/17 1453 09/19/17 0910 10/09/17 1554 10/17/17 0546   ITP Comments  Med Review completed. Initial ITP created. [facility]  Almin completed 2 min of the 6 min walk and needed to rest. While sitting, it was noted his heart rate was sustaining in 130s. SOB was relieved with rest and he thought it was because he usually gets SOB with movement. He rested in chair for 15 minutes and HR still maintained int he 130s. Patient brought  upstairs to ED to be evaluated.   30 day review  completed. ITP sent to Dr. Ramonita Lab, covering for Dr. Emily Filbert, Medical Director of Cardiac Rehab. Continue with ITP unless changes are made by physician.  new to program. Has not started exercise yet.   Dr. Doren Custard called to let us know that he has a follow up appointment with Uzziah next week.  Mr. Bruington was just discharged last week from a gout flare up and had some SVT while admitted then.  Dr. Doren Custard is considering a stress test prior to releasing him to start rehab.  He will let us know when Mr. Plotkin can start after his appointment next week.   30 day review completed. ITP sent to Dr. Emily Filbert, Medical Director of Cardiac Rehab. Continue with ITP unless changes are made by physician  HAs attended med review,out for medical concerns   [place] Name 10/24/17 1529 11/14/17 0652 12/05/17 0946 12/10/17 1726 12/12/17 0612   ITP Comments  Dr. Doren Custard called to update [place] on Mr. Quevedo.  He was supposed to do a stress test yesterday, but present volume overloaded.  Dr. Doren Custard made some medication adjustements to try to optimize him.  He will have Mr. Shukla come back in 2 weeks for his stress test and will update [place] on his status after that.   30 day review.  Continue with ITP unless directed changes per Medical Director review.  Remians out for medical reasons  Received clearance for Adron to start rehab.  Left message on voice mail at home to call to schedule first day.    Patient came today with clearance from doctor to resume rehab. It has been 2 months since his initial walk test so 6 min walk test was repeated today. Patient also met with dietician and mental health counselor today.   30 day review. Continue with ITP unless direccted changes per Medical Director Chart Review.     [place] Name 01/09/18 [phone] 01/23/18 1529 01/29/18 1022 02/05/18 0651 02/14/18 0807   ITP Comments  30 day review. Continue with ITP unless direccted changes per Medical Director Chart Review.  Mrs. Muzquiz called to let us know that  Terel has been sick since Monday afternoon.  He is very weak.   Price has been sick and missed class since last review.  30 Day Review. Continue with ITP unless directed changes per Medical Director review. 4 visits this month Last visit 12/12  Markevius has not attended since last review due to other medical issues.   [place] Name 02/28/18 0817 03/06/18 1405         ITP Comments  Lelynd has not attended since last review due to other health issues.  Attempts to contact Ralf have been unsuccessful.  30 day review completed. ITP sent to Dr. Emily Filbert, Medical Director of Cardiac Rehab. Continue with ITP unless changes are made by physician.  Eriel has been out since 01/17/18         Comments: 30 day review

## 2018-03-11 ENCOUNTER — Encounter: Payer: No Typology Code available for payment source | Attending: Internal Medicine

## 2018-03-11 DIAGNOSIS — I5022 Chronic systolic (congestive) heart failure: Secondary | ICD-10-CM | POA: Insufficient documentation

## 2018-03-11 DIAGNOSIS — Z87891 Personal history of nicotine dependence: Secondary | ICD-10-CM | POA: Insufficient documentation

## 2018-03-14 NOTE — Progress Notes (Signed)
Discharge Progress Report  Patient Details  Name: Trevor Jennings MRN: [id number] Date of Birth: 28-Sep-1940 Referring Provider:     Cardiac Rehab from 09/13/2017 in [facility]  [facility]  [facility]       Number of Visits: 21  Reason for Discharge:  Early Exit:  Personal  Smoking History:  Social History   Tobacco Use  Smoking Status Former Smoker  . Last attempt to quit: 1995  . Years since quitting: 25.1  Smokeless Tobacco Never Used    Diagnosis:  No diagnosis found.  ADL [facility]:   Initial Exercise Prescription:   Discharge Exercise Prescription (Final Exercise Prescription Changes): Exercise Prescription Changes - 01/17/18 0800      Response to Exercise   Blood Pressure (Admit)  142/80    Blood Pressure (Exercise)  134/58    Blood Pressure (Exit)  122/70    Heart Rate (Admit)  74 bpm    Heart Rate (Exercise)  97 bpm    Heart Rate (Exit)  79 bpm    Rating of Perceived Exertion (Exercise)  13    Symptoms  none    Duration  Progress to 45 minutes of aerobic exercise without signs/symptoms of physical distress    Intensity  Other (comment)   pacemaker     Progression   Progression  Continue to progress workloads to maintain intensity without signs/symptoms of physical distress.      Resistance Training   Training Prescription  Yes    Weight  3 lb    Reps  10-15      Interval Training   Interval Training  No      Treadmill   MPH  0.8    Grade  0      T5 Nustep   Level  1    SPM  60    Minutes  15      Track   Laps  14    METs  1.7      Home Exercise Plan   Plans to continue exercise at  Home (comment)   exercise chair with DVD's at home, and walking   Frequency  Add 2 additional days to program exercise sessions.    Initial Home Exercises Provided  12/27/17       Functional Capacity: 6 Minute Walk    Row Name 12/10/17 1646         6 Minute Walk   Phase  Initial     Distance  380 feet     Walk Time  2.75  minutes     # of Rest Breaks  3     MPH  0.72     RPE  13     Symptoms  Yes (comment)     Comments  shortness of breath and tiredness     Resting HR  64 bpm     Resting BP  110/70     Resting Oxygen Saturation   99 %     Exercise Oxygen Saturation  during 6 min walk  100 %     Max Ex. HR  77 bpm     Max Ex. BP  140/70     2 Minute Post BP  140/70        Psychological, QOL, Others - Outcomes: PHQ 2/9: Depression screen [facility] 2/9 12/10/2017 09/13/2017  Decreased Interest 0 2  Down, Depressed, Hopeless 2 2  PHQ - 2 Score 2 4  Altered sleeping 1 2  Tired,  decreased energy 3 3  Change in appetite 1 0  Feeling bad or failure about yourself  2 0  Trouble concentrating 3 2  Moving slowly or fidgety/restless 3 0  Suicidal thoughts 0 0  PHQ-9 Score 15 11  Difficult doing work/chores Not difficult at all Somewhat difficult    Quality of Life:   Personal Goals: Goals established at orientation with interventions provided to work toward goal.    Personal Goals Discharge: Goals and Risk Factor Review    Row Name 12/19/17 1551 01/17/18 1650           Core Components/Risk Factors/Patient Goals Review   Personal Goals Review  Lipids;Hypertension;Diabetes  Weight Management/Obesity;Improve shortness of breath with ADL's;Heart Failure;Hypertension;Lipids      Review  Trevor Jennings has been measuring BP and BG at home.  BG has been better since he started exercising.  He wants it to be below 150 fasting.  Trevor Jennings states he is doing well with exercise - his legs are getting stronger and he looks forward to coming.  He is taking meds as directed.  He wants to get to 270 lb.  He was 302 today  Trevor Jennings is walking to and from entrance with his walker.  He also is up to 14 laps in class.  He has a couple blood tests next week for BG.  He has been having big fluctuations in BG.  He has had some stress due to recent death of his sister.  He would like to speak with counselor next week.      Expected Outcomes   Short - meet with RD about healthy dietary habits Long - achieve BG lower than 150, add walking to exercise Long - walk to and from elevator (without walker)   Short - follow through with tests for BG Long - be able to control BG better          Exercise Goals and Review:   Exercise Goals Re-Evaluation: Exercise Goals Re-Evaluation    Row Name 12/19/17 1154 12/27/17 1659 01/01/18 0824 01/17/18 0831       Exercise Goal Re-Evaluation   Exercise Goals Review  Increase Physical Activity;Increase Strength and Stamina;Able to understand and use rate of perceived exertion (RPE) scale  Increase Physical Activity;Increase Strength and Stamina;Able to understand and use rate of perceived exertion (RPE) scale;Knowledge and understanding of Target Heart Rate Range (THRR);Understanding of Exercise Prescription;Able to check pulse independently  Increase Physical Activity;Increase Strength and Stamina;Able to understand and use rate of perceived exertion (RPE) scale;Knowledge and understanding of Target Heart Rate Range (THRR);Understanding of Exercise Prescription  Increase Physical Activity;Increase Strength and Stamina;Able to understand and use rate of perceived exertion (RPE) scale    Comments  Trevor Jennings makes good effort when he exercises.  Staff will encourage him to start walking this week.  Reviewed home exercise with pt today.  Pt plans to use exercise chair with DVD's and walk at home for exercise.  Reviewed THR, pulse, RPE, sign and symptoms, NTG use, and when to call 911 or MD.  Also discussed weather considerations and indoor options.  Pt voiced understanding.  Trevor Jennings was able to walk all the way from elevator to gym without rest.  He has better ROM for flexibility exercises.    Trevor Jennings continues to show improvement with endurance.  He walked 14 laps last session and tried the TM.  Staff will monitor progressions    Expected Outcomes  Short - add walking to exercise Long - increase overall MET  level   Short: add 1-2 days of exercise at home on off days of cardiac rehab class. Long: Become indepenedent with exericse routine.   Short - walk to and from elevator and gym  Long - walk without walker   Short - 15 laps without stopping walking Long - increase overall endurance        Nutrition & Weight - Outcomes: Pre Biometrics - 12/10/17 1651      Pre Biometrics   Height  5' 10"  (1.778 m)    Weight  (!) 310 lb 6.4 oz (140.8 kg)    Waist Circumference  55 inches    Hip Circumference  54 inches    Waist to Hip Ratio  1.02 %    BMI (Calculated)  44.54        Nutrition: Nutrition Therapy & Goals - 12/12/17 0941      Nutrition Therapy   Diet  DM    Drug/Food Interactions  Statins/Certain Fruits    Protein (specify units)  12oz    Fiber  35 grams    Whole Grain Foods  3 servings   chooses some whole grains   Saturated Fats  16 max. grams    Fruits and Vegetables  6 servings/day   8 ideal; typically eats 1 serving of fruit per day   Sodium  1500 grams      Personal Nutrition Goals   Nutrition Goal  Add a protien source at breakfast and snacks more often to help better manage blood sugars.    Personal Goal #2  Eat less foods that contain added sugar IE doughnuts, cookies and candy. Instead, trial the new snack options you recently purchased such as yogurt, nuts and cheese    Personal Goal #3  Try not to skip meals during the day. If you are not hungry for lunch, at least add a protein source with your typical fruit    Comments  Pt reports his BG readings to be varying more than normal lately. Recent BG readings range from 165-190. Breakfast: oatmeal, cereal like Raisin Bran, eggs and 2 slices bacon, or doughnuts occasionally; Lunch: may skip and have a snack of fruit; Dinner: pork chops, hamburgers, pot pie, cabbage, chicken, broccoli, other vegetable varieties. They eat meals mostly at home and his wife does not cook with salt. He drinks water and coffee. They try to "watch" their starch  intake in addition to salt. Per August 2019 labs sodium was below normal limits.       Intervention Plan   Intervention  Prescribe, educate and counsel regarding individualized specific dietary modifications aiming towards targeted core components such as weight, hypertension, lipid management, diabetes, heart failure and other comorbidities.    Expected Outcomes  Long Term Goal: Adherence to prescribed nutrition plan.;Short Term Goal: A plan has been developed with personal nutrition goals set during dietitian appointment.;Short Term Goal: Understand basic principles of dietary content, such as calories, fat, sodium, cholesterol and nutrients.       Nutrition Discharge:   Education Questionnaire Score:   Goals reviewed with patient; copy given to patient.

## 2018-03-14 NOTE — Progress Notes (Signed)
Cardiac Individual Treatment Plan  Patient Details  Name: Trevor Jennings MRN: [id number] Date of Birth: March 14, 1940 Referring Provider:     Cardiac Rehab from 09/13/2017 in [facility]  [facility]      Initial Encounter Date:    Cardiac Rehab from 09/13/2017 in [facility] Cardiac and Pulmonary Rehab  Date  09/13/17      Visit Diagnosis: No diagnosis found.  Patient's Home Medications on Admission:  Current Outpatient Medications:  .  albuterol (PROVENTIL HFA;VENTOLIN HFA) 108 (90 Base) MCG/ACT inhaler, Inhale into the lungs., Disp: , Rfl:  .  aspirin EC 81 MG tablet, Take by mouth., Disp: , Rfl:  .  atorvastatin (LIPITOR) 80 MG tablet, Take by mouth., Disp: , Rfl:  .  carvedilol (COREG) 25 MG tablet, Take by mouth., Disp: , Rfl:  .  Cholecalciferol (VITAMIN D-1000 MAX ST) 1000 units tablet, Take by mouth., Disp: , Rfl:  .  citalopram (CELEXA) 40 MG tablet, Take by mouth., Disp: , Rfl:  .  fluticasone (FLONASE) 50 MCG/ACT nasal spray, 2 sprays by Each Nare route daily., Disp: , Rfl:  .  gabapentin (NEURONTIN) 100 MG capsule, Take by mouth., Disp: , Rfl:  .  insulin glargine (LANTUS) 100 UNIT/ML injection, Inject into the skin., Disp: , Rfl:  .  insulin regular (NOVOLIN R,HUMULIN R) 100 units/mL injection, Inject into the skin., Disp: , Rfl:  .  loratadine (CLARITIN) 10 MG tablet, Take 10 mg by mouth daily., Disp: , Rfl:  .  pantoprazole (PROTONIX) 40 MG tablet, Take by mouth., Disp: , Rfl:  .  torsemide (DEMADEX) 20 MG tablet, Take by mouth., Disp: , Rfl:   Past Medical History: No past medical history on file.  Tobacco Use: Social History   Tobacco Use  Smoking Status Former Smoker  . Last attempt to quit: 1995  . Years since quitting: 25.1  Smokeless Tobacco Never Used    Labs: Recent Review Flowsheet Data    There is no flowsheet data to display.       Exercise Target Goals: Exercise Program Goal: Individual exercise prescription set  using results from initial 6 min walk test and THRR while considering  patient's activity barriers and safety.   Exercise Prescription Goal: Initial exercise prescription builds to 30-45 minutes a day of aerobic activity, 2-3 days per week.  Home exercise guidelines will be given to patient during program as part of exercise prescription that the participant will acknowledge.  Activity Barriers & Risk Stratification:   6 Minute Walk: 6 Minute Walk    Row Name 12/10/17 1646         6 Minute Walk   Phase  Initial     Distance  380 feet     Walk Time  2.75 minutes     # of Rest Breaks  3     MPH  0.72     RPE  13     Symptoms  Yes (comment)     Comments  shortness of breath and tiredness     Resting HR  64 bpm     Resting BP  110/70     Resting Oxygen Saturation   99 %     Exercise Oxygen Saturation  during 6 min walk  100 %     Max Ex. HR  77 bpm     Max Ex. BP  140/70     2 Minute Post BP  140/70  Oxygen Initial Assessment:   Oxygen Re-Evaluation:   Oxygen Discharge (Final Oxygen Re-Evaluation):   Initial Exercise Prescription:   Perform Capillary Blood Glucose checks as needed.  Exercise Prescription Changes: Exercise Prescription Changes    Row Name 12/10/17 1600 12/19/17 1100 12/27/17 1600 01/01/18 0800 01/17/18 0800     Response to Exercise   Blood Pressure (Admit)  110/70  120/66  -  138/64  142/80   Blood Pressure (Exercise)  140/70  136/60  -  126/66  134/58   Blood Pressure (Exit)  104/70  130/82  -  -  122/70   Heart Rate (Admit)  64 bpm  95 bpm  -  70 bpm  74 bpm   Heart Rate (Exercise)  77 bpm  117 bpm  -  103 bpm  97 bpm   Heart Rate (Exit)  65 bpm  67 bpm  -  73 bpm  79 bpm   Oxygen Saturation (Admit)  99 %  -  -  -  -   Oxygen Saturation (Exercise)  100 %  -  -  -  -   Oxygen Saturation (Exit)  100 %  -  -  -  -   Rating of Perceived Exertion (Exercise)  13  15  -  11  13   Symptoms  -  -  -  did not feel PVCs  none   Duration  -   Progress to 45 minutes of aerobic exercise without signs/symptoms of physical distress  Progress to 45 minutes of aerobic exercise without signs/symptoms of physical distress  Progress to 45 minutes of aerobic exercise without signs/symptoms of physical distress  Progress to 45 minutes of aerobic exercise without signs/symptoms of physical distress   Intensity  -  THRR unchanged  THRR unchanged  Other (comment) pacemaker  Other (comment) pacemaker     Progression   Progression  -  Continue to progress workloads to maintain intensity without signs/symptoms of physical distress.  Continue to progress workloads to maintain intensity without signs/symptoms of physical distress.  Continue to progress workloads to maintain intensity without signs/symptoms of physical distress.  Continue to progress workloads to maintain intensity without signs/symptoms of physical distress.   Average METs  -  2.1  2.1  2  -     Resistance Training   Training Prescription  Yes  Yes  Yes  Yes  Yes   Weight  3 lb  3 lb  3 lb  3 lb  3 lb   Reps  10-15  10-15  10-15  10-15  10-15     Interval Training   Interval Training  -  No  No  No  No     Treadmill   MPH  0.8  -  -  -  0.8   Grade  0  -  -  -  0   Minutes  15 rest as needed  -  -  -  -   METs  1  -  -  -  -     T5 Nustep   Level  _0 SPM  60  60  60  60  60   Minutes  _1 Biostep-RELP   Level  _2 -  -   SPM  50  50  50  -  -  Minutes  _0 -  -   METs  _1 -  -     Track   Laps  10  -  -  12  14   Minutes  15 rest as needed  -  -  -  -   METs  1  -  -  1.55  1.7     Home Exercise Plan   Plans to continue exercise at  -  -  Home (comment) exercise chair with DVD's at home, and walking  Home (comment) exercise chair with DVD's at home, and walking  Home (comment) exercise chair with DVD's at home, and walking   Frequency  -  -  Add 2 additional days to program exercise sessions.  Add 2 additional days  to program exercise sessions.  Add 2 additional days to program exercise sessions.   Initial Home Exercises Provided  -  -  12/27/17  12/27/17  12/27/17      Exercise Comments:   Exercise Goals and Review:   Exercise Goals Re-Evaluation : Exercise Goals Re-Evaluation    Row Name 12/19/17 1154 12/27/17 1659 01/01/18 0824 01/17/18 0831       Exercise Goal Re-Evaluation   Exercise Goals Review  Increase Physical Activity;Increase Strength and Stamina;Able to understand and use rate of perceived exertion (RPE) scale  Increase Physical Activity;Increase Strength and Stamina;Able to understand and use rate of perceived exertion (RPE) scale;Knowledge and understanding of Target Heart Rate Range (THRR);Understanding of Exercise Prescription;Able to check pulse independently  Increase Physical Activity;Increase Strength and Stamina;Able to understand and use rate of perceived exertion (RPE) scale;Knowledge and understanding of Target Heart Rate Range (THRR);Understanding of Exercise Prescription  Increase Physical Activity;Increase Strength and Stamina;Able to understand and use rate of perceived exertion (RPE) scale    Comments  Kemper makes good effort when he exercises.  Staff will encourage him to start walking this week.  Reviewed home exercise with pt today.  Pt plans to use exercise chair with DVD's and walk at home for exercise.  Reviewed THR, pulse, RPE, sign and symptoms, NTG use, and when to call 911 or [place].  Also discussed weather considerations and indoor options.  Pt voiced understanding.  Zacchary was able to walk all the way from elevator to gym without rest.  He has better ROM for flexibility exercises.    Federick continues to show improvement with endurance.  He walked 14 laps last session and tried the TM.  Staff will monitor progressions    Expected Outcomes  Short - add walking to exercise Long - increase overall MET level  Short: add 1-2 days of exercise at home on off days of cardiac  rehab class. Long: Become indepenedent with exericse routine.   Short - walk to and from elevator and gym  Long - walk without walker   Short - 15 laps without stopping walking Long - increase overall endurance        Discharge Exercise Prescription (Final Exercise Prescription Changes): Exercise Prescription Changes - 01/17/18 0800      Response to Exercise   Blood Pressure (Admit)  142/80    Blood Pressure (Exercise)  134/58    Blood Pressure (Exit)  122/70    Heart Rate (Admit)  74 bpm    Heart Rate (Exercise)  97 bpm    Heart Rate (Exit)  79 bpm    Rating of Perceived Exertion (Exercise)  13  Symptoms  none    Duration  Progress to 45 minutes of aerobic exercise without signs/symptoms of physical distress    Intensity  Other (comment)   pacemaker     Progression   Progression  Continue to progress workloads to maintain intensity without signs/symptoms of physical distress.      Resistance Training   Training Prescription  Yes    Weight  3 lb    Reps  10-15      Interval Training   Interval Training  No      Treadmill   MPH  0.8    Grade  0      T5 Nustep   Level  1    SPM  60    Minutes  15      Track   Laps  14    METs  1.7      Home Exercise Plan   Plans to continue exercise at  Home (comment)   exercise chair with DVD's at home, and walking   Frequency  Add 2 additional days to program exercise sessions.    Initial Home Exercises Provided  12/27/17       Nutrition:  Target Goals: Understanding of nutrition guidelines, daily intake of sodium <1558m, cholesterol <2081m calories 30% from fat and 7% or less from saturated fats, daily to have 5 or more servings of fruits and vegetables.  Biometrics: Pre Biometrics - 12/10/17 1651      Pre Biometrics   Height  _0  (1.778 m)    Weight  (!) 310 lb 6.4 oz (140.8 kg)    Waist Circumference  55 inches    Hip Circumference  54 inches    Waist to Hip Ratio  1.02 %    BMI (Calculated)  44.54         Nutrition Therapy Plan and Nutrition Goals: Nutrition Therapy & Goals - 12/12/17 0941      Nutrition Therapy   Diet  DM    Drug/Food Interactions  Statins/Certain Fruits    Protein (specify units)  12oz    Fiber  35 grams    Whole Grain Foods  3 servings   chooses some whole grains   Saturated Fats  16 max. grams    Fruits and Vegetables  6 servings/day   8 ideal; typically eats 1 serving of fruit per day   Sodium  1500 grams      Personal Nutrition Goals   Nutrition Goal  Add a protien source at breakfast and snacks more often to help better manage blood sugars.    Personal Goal #2  Eat less foods that contain added sugar IE doughnuts, cookies and candy. Instead, trial the new snack options you recently purchased such as yogurt, nuts and cheese    Personal Goal #3  Try not to skip meals during the day. If you are not hungry for lunch, at least add a protein source with your typical fruit    Comments  Pt reports his BG readings to be varying more than normal lately. Recent BG readings range from 165-190. Breakfast: oatmeal, cereal like Raisin Bran, eggs and 2 slices bacon, or doughnuts occasionally; Lunch: may skip and have a snack of fruit; Dinner: pork chops, hamburgers, pot pie, cabbage, chicken, broccoli, other vegetable varieties. They eat meals mostly at home and his wife does not cook with salt. He drinks water and coffee. They try to "watch" their starch intake in addition to salt. Per August 2019 labs  sodium was below normal limits.       Intervention Plan   Intervention  Prescribe, educate and counsel regarding individualized specific dietary modifications aiming towards targeted core components such as weight, hypertension, lipid management, diabetes, heart failure and other comorbidities.    Expected Outcomes  Long Term Goal: Adherence to prescribed nutrition plan.;Short Term Goal: A plan has been developed with personal nutrition goals set during dietitian  appointment.;Short Term Goal: Understand basic principles of dietary content, such as calories, fat, sodium, cholesterol and nutrients.       Nutrition Assessments:   Nutrition Goals Re-Evaluation: Nutrition Goals Re-Evaluation    Row Name 12/12/17 1047 12/19/17 1732           Goals   Nutrition Goal  Eat less foods that contain added sugar IE doughnuts, cookies and candy. Instead, trial the new snack options you recently purchased such as yogurt, nuts and cheese  Add a protein source at breakfast and snacks more often to help better manage blood sugars; Eat less foods that contain added sugars and instead trial new/lower sugar snack options; Try not to skip meals during the day- if you are not hungry mid-day at least have a snack, ideally that contains both CHO and PRO      Comment  He keeps candy on him on most days that he uses both as a snack and to treat hypoglycemia. He and his wife split half a dozen doughnuts on occasion for breakfast. Per wife, they recently purchased different snack options to try that are more nutrient-dense which they plan to trial this week  He has been working with his wife to use less salt and consume less added sugar foods. He is learning over time to practice better portion control and eat foods he enjoys in moderation. For example, rather than 6 doughnuts a day he has made it a goal to have 1 doughnut every month, and is also learning to be satisfied with one cooking rather than several as a snack. He trialed the yogurt as a snack and enjoys it. At breakfast he has been including eggs occasionally for a protein source      Expected Outcome  He will eat less foods that contain added sugars and replace these with foods that have natural sugars and/or that have more nutrient density  He will continue to practice portion control and moderation and learn what balanced eating means for himself. He will be more consistent about adding a protein source at meals / snacks and  to choose lower sugar items overall        Personal Goal #2 Re-Evaluation   Personal Goal #2  Add a protein source at breakfast and snacks more often to better manage blood sugars  -        Personal Goal #3 Re-Evaluation   Personal Goal #3  Try not to skip meals during the day. If you are not hungry for lunch, at least add a protein source with your typical fruit  -         Nutrition Goals Discharge (Final Nutrition Goals Re-Evaluation): Nutrition Goals Re-Evaluation - 12/19/17 1732      Goals   Nutrition Goal  Add a protein source at breakfast and snacks more often to help better manage blood sugars; Eat less foods that contain added sugars and instead trial new/lower sugar snack options; Try not to skip meals during the day- if you are not hungry mid-day at least have a snack, ideally that  contains both CHO and PRO    Comment  He has been working with his wife to use less salt and consume less added sugar foods. He is learning over time to practice better portion control and eat foods he enjoys in moderation. For example, rather than 6 doughnuts a day he has made it a goal to have 1 doughnut every month, and is also learning to be satisfied with one cooking rather than several as a snack. He trialed the yogurt as a snack and enjoys it. At breakfast he has been including eggs occasionally for a protein source    Expected Outcome  He will continue to practice portion control and moderation and learn what balanced eating means for himself. He will be more consistent about adding a protein source at meals / snacks and to choose lower sugar items overall       Psychosocial: Target Goals: Acknowledge presence or absence of significant depression and/or stress, maximize coping skills, provide positive support system. Participant is able to verbalize types and ability to use techniques and skills needed for reducing stress and depression.   Initial Review & Psychosocial Screening:   Quality of  Life Scores:   Scores of 19 and below usually indicate a poorer quality of life in these areas.  A difference of  2-3 points is a clinically meaningful difference.  A difference of 2-3 points in the total score of the Quality of Life Index has been associated with significant improvement in overall quality of life, self-image, physical symptoms, and general health in studies assessing change in quality of life.  PHQ-9: Recent Review Flowsheet Data    Depression screen [facility] 2/9 12/10/2017 09/13/2017   Decreased Interest 0 2   Down, Depressed, Hopeless 2 2   PHQ - 2 Score 2 4   Altered sleeping 1 2   Tired, decreased energy 3 3   Change in appetite 1 0   Feeling bad or failure about yourself  2 0   Trouble concentrating 3 2   Moving slowly or fidgety/restless 3 0   Suicidal thoughts 0 0   PHQ-9 Score 15 11   Difficult doing work/chores Not difficult at all Somewhat difficult     Interpretation of Total Score  Total Score Depression Severity:  1-4 = Minimal depression, 5-9 = Mild depression, 10-14 = Moderate depression, 15-19 = Moderately severe depression, 20-27 = Severe depression   Psychosocial Evaluation and Intervention: Psychosocial Evaluation - 12/10/17 1739      Psychosocial Evaluation & Interventions   Interventions  Stress management education;Encouraged to exercise with the program and follow exercise prescription    Comments  Counselor met with Mr. [facility]) accompanied by his spouse - Everlene Farrier today for an initial psychsocial evaluation.  He is a 78 year old who has COPD and has been in and out of the hospital since August - due to falling;dehydration;and low potassium.  He also struggles with diabetes and HBP.  Jamesyn has a strong support system with his spouse, his daughter in [place]; & spouse's extensive family locally.  He sleeps well with the use of a CPAP and has a good appetite.  Jessen reports a history of depression and is on medication to help with this. He states he  is typically in a good mood but his spouse reports this being up and down at times with irritability on occasion.  His goals for this program are to feel stronger; have more energy; be able to walk and be  more flexible to tie his own shoes.  He would like to lose some weight also if possible.  Counselor informed Brayton of his PHQ-9 scores of 15 -indicating moderately severe symptoms of depression with feeling down or depressed and feeling bad about himself in his current health condition.  Maejor also reported his sister passed away  yesterday and this could impact his scores/mood today.  Counselor encouraged Chanceler to attend and exercise consistently; to find ways to manage his stress and have support in his grief at this time.  Counselor will follow with him.     Expected Outcomes  Short:  Zebediah will attend and exercise consistently for his health and mental health.  Zakkery will participate in the psychoeducational components of this program - including stress and depression to learn positive coping strategies.  Long:  Yutaka will develop a routine of positive self-care for his health and mental health.    Continue Psychosocial Services   Follow up required by counselor       Psychosocial Re-Evaluation:   Psychosocial Discharge (Final Psychosocial Re-Evaluation):   Vocational Rehabilitation: Provide vocational rehab assistance to qualifying candidates.   Vocational Rehab Evaluation & Intervention:   Education: Education Goals: Education classes will be provided on a variety of topics geared toward better understanding of heart health and risk factor modification. Participant will state understanding/return demonstration of topics presented as noted by education test scores.  Learning Barriers/Preferences:   Education Topics:  AED/CPR: - Group verbal and written instruction with the use of models to demonstrate the basic use of the AED with the basic ABC's of resuscitation.   General  Nutrition Guidelines/Fats and Fiber: -Group instruction provided by verbal, written material, models and posters to present the general guidelines for heart healthy nutrition. Gives an explanation and review of dietary fats and fiber.   Controlling Sodium/Reading Food Labels: -Group verbal and written material supporting the discussion of sodium use in heart healthy nutrition. Review and explanation with models, verbal and written materials for utilization of the food label.   Cardiac Rehab from 01/16/2018 in [facility] Cardiac and Pulmonary Rehab  Date  12/12/17  Educator  LB  Instruction Review Code  1- Verbalizes Understanding      Exercise Physiology & General Exercise Guidelines: - Group verbal and written instruction with models to review the exercise physiology of the cardiovascular system and associated critical values. Provides general exercise guidelines with specific guidelines to those with heart or lung disease.    Aerobic Exercise & Resistance Training: - Gives group verbal and written instruction on the various components of exercise. Focuses on aerobic and resistive training programs and the benefits of this training and how to safely progress through these programs..   Cardiac Rehab from 01/16/2018 in [facility] Cardiac and Pulmonary Rehab  Date  12/26/17  Educator  Nada Maclachlan, EP  Instruction Review Code  2- Demonstrated Understanding      Flexibility, Balance, Mind/Body Relaxation: Provides group verbal/written instruction on the benefits of flexibility and balance training, including mind/body exercise modes such as yoga, pilates and tai chi.  Demonstration and skill practice provided.   Cardiac Rehab from 01/16/2018 in [facility] Cardiac and Pulmonary Rehab  Date  12/31/17  Educator  AS  Instruction Review Code  1- Verbalizes Understanding      Stress and Anxiety: - Provides group verbal and written instruction about the health risks of elevated stress and causes of high  stress.  Discuss the correlation between heart/lung disease and anxiety and treatment  options. Review healthy ways to manage with stress and anxiety.   Depression: - Provides group verbal and written instruction on the correlation between heart/lung disease and depressed mood, treatment options, and the stigmas associated with seeking treatment.   Cardiac Rehab from 01/16/2018 in [facility] Cardiac and Pulmonary Rehab  Date  01/16/18  Educator  [facility]  Instruction Review Code  1- Verbalizes Understanding      Anatomy & Physiology of the Heart: - Group verbal and written instruction and models provide basic cardiac anatomy and physiology, with the coronary electrical and arterial systems. Review of Valvular disease and Heart Failure   Cardiac Procedures: - Group verbal and written instruction to review commonly prescribed medications for heart disease. Reviews the medication, class of the drug, and side effects. Includes the steps to properly store meds and maintain the prescription regimen. (beta blockers and nitrates)   Cardiac Rehab from 01/16/2018 in [facility] Cardiac and Pulmonary Rehab  Date  01/09/18  Educator  [facility]  Instruction Review Code  1- Verbalizes Understanding      Cardiac Medications I: - Group verbal and written instruction to review commonly prescribed medications for heart disease. Reviews the medication, class of the drug, and side effects. Includes the steps to properly store meds and maintain the prescription regimen.   Cardiac Medications II: -Group verbal and written instruction to review commonly prescribed medications for heart disease. Reviews the medication, class of the drug, and side effects. (all other drug classes)   Cardiac Rehab from 01/16/2018 in [facility] Cardiac and Pulmonary Rehab  Date  01/07/18  Educator  CE  Instruction Review Code  1- Verbalizes Understanding       Go Sex-Intimacy & Heart Disease, Get SMART - Goal Setting: - Group verbal and written  instruction through game format to discuss heart disease and the return to sexual intimacy. Provides group verbal and written material to discuss and apply goal setting through the application of the S.M.A.R.T. Method.   Cardiac Rehab from 01/16/2018 in [facility] Cardiac and Pulmonary Rehab  Date  01/09/18  Educator  [facility]  Instruction Review Code  1- Verbalizes Understanding      Other Matters of the Heart: - Provides group verbal, written materials and models to describe Stable Angina and Peripheral Artery. Includes description of the disease process and treatment options available to the cardiac patient.   Exercise & Equipment Safety: - Individual verbal instruction and demonstration of equipment use and safety with use of the equipment.   Cardiac Rehab from 01/16/2018 in [facility] Cardiac and Pulmonary Rehab  Date  09/13/17  Educator  [facility]  Instruction Review Code  1- Verbalizes Understanding      Infection Prevention: - Provides verbal and written material to individual with discussion of infection control including proper hand washing and proper equipment cleaning during exercise session.   Cardiac Rehab from 01/16/2018 in [facility] Cardiac and Pulmonary Rehab  Date  09/13/17  Educator  [facility]  Instruction Review Code  1- Verbalizes Understanding      Falls Prevention: - Provides verbal and written material to individual with discussion of falls prevention and safety.   Cardiac Rehab from 01/16/2018 in [facility] Cardiac and Pulmonary Rehab  Date  09/13/17  Educator  [facility]  Instruction Review Code  1- Verbalizes Understanding      Diabetes: - Individual verbal and written instruction to review signs/symptoms of diabetes, desired ranges of glucose level fasting, after meals and with exercise. Acknowledge that pre and post exercise glucose checks will be  done for 3 sessions at entry of program.   Cardiac Rehab from 01/16/2018 in [facility] Cardiac and Pulmonary Rehab  Date  09/13/17  Educator  [facility]   Instruction Review Code  1- Verbalizes Understanding      Know Your Numbers and Risk Factors: -Group verbal and written instruction about important numbers in your health.  Discussion of what are risk factors and how they play a role in the disease process.  Review of Cholesterol, Blood Pressure, Diabetes, and BMI and the role they play in your overall health.   Cardiac Rehab from 01/16/2018 in [facility] Cardiac and Pulmonary Rehab  Date  01/07/18  Educator  [facility]  Instruction Review Code  1- Verbalizes Understanding      Sleep Hygiene: -Provides group verbal and written instruction about how sleep can affect your health.  Define sleep hygiene, discuss sleep cycles and impact of sleep habits. Review good sleep hygiene tips.    Cardiac Rehab from 01/16/2018 in [facility] Cardiac and Pulmonary Rehab  Date  12/19/17  Educator  [facility]  Instruction Review Code  1- Verbalizes Understanding      Other: -Provides group and verbal instruction on various topics (see comments)   Knowledge Questionnaire Score:   Core Components/Risk Factors/Patient Goals at Admission:   Core Components/Risk Factors/Patient Goals Review:  Goals and Risk Factor Review    Row Name 12/19/17 1551 01/17/18 1650           Core Components/Risk Factors/Patient Goals Review   Personal Goals Review  Lipids;Hypertension;Diabetes  Weight Management/Obesity;Improve shortness of breath with ADL's;Heart Failure;Hypertension;Lipids      Review  Famous has been measuring BP and BG at home.  BG has been better since he started exercising.  He wants it to be below 150 fasting.  Nimrod states he is doing well with exercise - his legs are getting stronger and he looks forward to coming.  He is taking meds as directed.  He wants to get to 270 lb.  He was 302 today  Otniel is walking to and from entrance with his walker.  He also is up to 14 laps in class.  He has a couple blood tests next week for BG.  He has been having big fluctuations in  BG.  He has had some stress due to recent death of his sister.  He would like to speak with counselor next week.      Expected Outcomes  Short - meet with RD about healthy dietary habits Long - achieve BG lower than 150, add walking to exercise Long - walk to and from elevator (without walker)   Short - follow through with tests for BG Long - be able to control BG better          Core Components/Risk Factors/Patient Goals at Discharge (Final Review):  Goals and Risk Factor Review - 01/17/18 1650      Core Components/Risk Factors/Patient Goals Review   Personal Goals Review  Weight Management/Obesity;Improve shortness of breath with ADL's;Heart Failure;Hypertension;Lipids    Review  Janice is walking to and from entrance with his walker.  He also is up to 14 laps in class.  He has a couple blood tests next week for BG.  He has been having big fluctuations in BG.  He has had some stress due to recent death of his sister.  He would like to speak with counselor next week.    Expected Outcomes  Short - follow through with tests for BG Long - be  able to control BG better        ITP Comments: ITP Comments    Row Name 09/19/17 0910 10/09/17 1554 10/17/17 0546 10/24/17 1529 11/14/17 0652   ITP Comments  30 day review completed. ITP sent to Dr. Ramonita Lab, covering for Dr. Emily Filbert, Medical Director of Cardiac Rehab. Continue with ITP unless changes are made by physician.  new to program. Has not started exercise yet.   Dr. Doren Custard called to let us know that he has a follow up appointment with Mikey next week.  Mr. Fettig was just discharged last week from a gout flare up and had some SVT while admitted then.  Dr. Doren Custard is considering a stress test prior to releasing him to start rehab.  He will let us know when Mr. Dunklee can start after his appointment next week.   30 day review completed. ITP sent to Dr. Emily Filbert, Medical Director of Cardiac Rehab. Continue with ITP unless changes are made by  physician  HAs attended med review,out for medical concerns  Dr. Doren Custard called to update [place] on Mr. Rallo.  He was supposed to do a stress test yesterday, but present volume overloaded.  Dr. Doren Custard made some medication adjustements to try to optimize him.  He will have Mr. Mossberg come back in 2 weeks for his stress test and will update [place] on his status after that.   30 day review.  Continue with ITP unless directed changes per Medical Director review.  Remians out for medical reasons   Row Name 12/05/17 0946 12/10/17 1726 12/12/17 0612 01/09/18 0621 01/23/18 1529   ITP Comments  Received clearance for Romelle to start rehab.  Left message on voice mail at home to call to schedule first day.    Patient came today with clearance from doctor to resume rehab. It has been 2 months since his initial walk test so 6 min walk test was repeated today. Patient also met with dietician and mental health counselor today.   30 day review. Continue with ITP unless direccted changes per Medical Director Chart Review.    30 day review. Continue with ITP unless direccted changes per Medical Director Chart Review.  Mrs. Labrie called to let us know that Derward has been sick since Monday afternoon.  He is very weak.    [place] Name 01/29/18 1022 02/05/18 0651 02/14/18 0807 02/28/18 0817 03/06/18 1405   ITP Comments  Kamerin has been sick and missed class since last review.  30 Day Review. Continue with ITP unless directed changes per Medical Director review. 4 visits this month Last visit 12/12  Bassel has not attended since last review due to other medical issues.  Irvan has not attended since last review due to other health issues.  Attempts to contact Barney have been unsuccessful.  30 day review completed. ITP sent to Dr. Emily Filbert, Medical Director of Cardiac Rehab. Continue with ITP unless changes are made by physician.  Nayden has been out since 01/17/18      Comments:discharge ITP

## 2018-07-08 DIAGNOSIS — I340 Nonrheumatic mitral (valve) insufficiency: Secondary | ICD-10-CM

## 2018-07-08 HISTORY — DX: Nonrheumatic mitral (valve) insufficiency: I34.0

## 2018-11-05 ENCOUNTER — Other Ambulatory Visit: Payer: Self-pay

## 2018-11-28 ENCOUNTER — Inpatient Hospital Stay
Admission: EM | Admit: 2018-11-28 | Discharge: 2018-12-02 | DRG: 683 | Disposition: A | Discharge status: Home Health | Payer: MEDICARE | Attending: Internal Medicine | Admitting: Internal Medicine

## 2018-11-28 ENCOUNTER — Other Ambulatory Visit: Payer: Self-pay

## 2018-11-28 ENCOUNTER — Emergency Department: Payer: MEDICARE

## 2018-11-28 DIAGNOSIS — N184 Chronic kidney disease, stage 4 (severe): Secondary | ICD-10-CM | POA: Diagnosis present

## 2018-11-28 DIAGNOSIS — J449 Chronic obstructive pulmonary disease, unspecified: Secondary | ICD-10-CM | POA: Diagnosis present

## 2018-11-28 DIAGNOSIS — Z7982 Long term (current) use of aspirin: Secondary | ICD-10-CM | POA: Diagnosis not present

## 2018-11-28 DIAGNOSIS — E785 Hyperlipidemia, unspecified: Secondary | ICD-10-CM | POA: Diagnosis present

## 2018-11-28 DIAGNOSIS — I130 Hypertensive heart and chronic kidney disease with heart failure and stage 1 through stage 4 chronic kidney disease, or unspecified chronic kidney disease: Secondary | ICD-10-CM | POA: Diagnosis present

## 2018-11-28 DIAGNOSIS — E860 Dehydration: Secondary | ICD-10-CM

## 2018-11-28 DIAGNOSIS — G4733 Obstructive sleep apnea (adult) (pediatric): Secondary | ICD-10-CM | POA: Diagnosis present

## 2018-11-28 DIAGNOSIS — D638 Anemia in other chronic diseases classified elsewhere: Secondary | ICD-10-CM | POA: Diagnosis present

## 2018-11-28 DIAGNOSIS — Z87891 Personal history of nicotine dependence: Secondary | ICD-10-CM | POA: Diagnosis not present

## 2018-11-28 DIAGNOSIS — Z952 Presence of prosthetic heart valve: Secondary | ICD-10-CM | POA: Diagnosis not present

## 2018-11-28 DIAGNOSIS — E1122 Type 2 diabetes mellitus with diabetic chronic kidney disease: Secondary | ICD-10-CM | POA: Diagnosis present

## 2018-11-28 DIAGNOSIS — E1121 Type 2 diabetes mellitus with diabetic nephropathy: Secondary | ICD-10-CM | POA: Diagnosis present

## 2018-11-28 DIAGNOSIS — Z794 Long term (current) use of insulin: Secondary | ICD-10-CM

## 2018-11-28 DIAGNOSIS — Z7951 Long term (current) use of inhaled steroids: Secondary | ICD-10-CM | POA: Diagnosis not present

## 2018-11-28 DIAGNOSIS — E876 Hypokalemia: Secondary | ICD-10-CM | POA: Diagnosis present

## 2018-11-28 DIAGNOSIS — Z95810 Presence of automatic (implantable) cardiac defibrillator: Secondary | ICD-10-CM | POA: Diagnosis not present

## 2018-11-28 DIAGNOSIS — Z79899 Other long term (current) drug therapy: Secondary | ICD-10-CM

## 2018-11-28 DIAGNOSIS — I10 Essential (primary) hypertension: Secondary | ICD-10-CM

## 2018-11-28 DIAGNOSIS — Z20828 Contact with and (suspected) exposure to other viral communicable diseases: Secondary | ICD-10-CM | POA: Diagnosis present

## 2018-11-28 DIAGNOSIS — Z23 Encounter for immunization: Secondary | ICD-10-CM | POA: Diagnosis present

## 2018-11-28 DIAGNOSIS — T501X5A Adverse effect of loop [high-ceiling] diuretics, initial encounter: Secondary | ICD-10-CM | POA: Diagnosis present

## 2018-11-28 DIAGNOSIS — N179 Acute kidney failure, unspecified: Secondary | ICD-10-CM | POA: Diagnosis not present

## 2018-11-28 DIAGNOSIS — I2510 Atherosclerotic heart disease of native coronary artery without angina pectoris: Secondary | ICD-10-CM | POA: Diagnosis present

## 2018-11-28 DIAGNOSIS — Z951 Presence of aortocoronary bypass graft: Secondary | ICD-10-CM | POA: Diagnosis not present

## 2018-11-28 DIAGNOSIS — E861 Hypovolemia: Secondary | ICD-10-CM | POA: Diagnosis present

## 2018-11-28 DIAGNOSIS — I5022 Chronic systolic (congestive) heart failure: Secondary | ICD-10-CM

## 2018-11-28 HISTORY — DX: Heart failure, unspecified: I50.9

## 2018-11-28 HISTORY — DX: Chronic obstructive pulmonary disease, unspecified: J44.9

## 2018-11-28 HISTORY — DX: Hyperlipidemia, unspecified: E78.5

## 2018-11-28 HISTORY — DX: Type 2 diabetes mellitus without complications: E11.9

## 2018-11-28 HISTORY — DX: Essential (primary) hypertension: I10

## 2018-11-28 HISTORY — DX: Disorder of kidney and ureter, unspecified: N28.9

## 2018-11-28 LAB — CBC WITH DIFFERENTIAL/PLATELET
Abs Immature Granulocytes: 0.04 10*3/uL (ref 0.00–0.07)
Basophils Absolute: 0.1 10*3/uL (ref 0.0–0.1)
Basophils Relative: 1 %
Eosinophils Absolute: 0.1 10*3/uL (ref 0.0–0.5)
Eosinophils Relative: 1 %
HCT: 35.7 % — ABNORMAL LOW (ref 39.0–52.0)
Hemoglobin: 10.5 g/dL — ABNORMAL LOW (ref 13.0–17.0)
Immature Granulocytes: 1 %
Lymphocytes Relative: 22 %
Lymphs Abs: 1.9 10*3/uL (ref 0.7–4.0)
MCH: 23.2 pg — ABNORMAL LOW (ref 26.0–34.0)
MCHC: 29.4 g/dL — ABNORMAL LOW (ref 30.0–36.0)
MCV: 78.8 fL — ABNORMAL LOW (ref 80.0–100.0)
Monocytes Absolute: 0.9 10*3/uL (ref 0.1–1.0)
Monocytes Relative: 11 %
Neutro Abs: 5.7 10*3/uL (ref 1.7–7.7)
Neutrophils Relative %: 64 %
Platelets: 235 10*3/uL (ref 150–400)
RBC: 4.53 MIL/uL (ref 4.22–5.81)
RDW: 20.7 % — ABNORMAL HIGH (ref 11.5–15.5)
WBC: 8.7 10*3/uL (ref 4.0–10.5)
nRBC: 0 % (ref 0.0–0.2)

## 2018-11-28 LAB — COMPREHENSIVE METABOLIC PANEL
ALT: 27 U/L (ref 0–44)
AST: 23 U/L (ref 15–41)
Albumin: 4 g/dL (ref 3.5–5.0)
Alkaline Phosphatase: 99 U/L (ref 38–126)
Anion gap: 20 — ABNORMAL HIGH (ref 5–15)
BUN: 87 mg/dL — ABNORMAL HIGH (ref 8–23)
CO2: 32 mmol/L (ref 22–32)
Calcium: 9.6 mg/dL (ref 8.9–10.3)
Chloride: 83 mmol/L — ABNORMAL LOW (ref 98–111)
Creatinine, Ser: 4.63 mg/dL — ABNORMAL HIGH (ref 0.61–1.24)
GFR calc Af Amer: 13 mL/min — ABNORMAL LOW (ref >60)
GFR calc non Af Amer: 11 mL/min — ABNORMAL LOW (ref >60)
Glucose, Bld: 135 mg/dL — ABNORMAL HIGH (ref 70–99)
Potassium: 3.2 mmol/L — ABNORMAL LOW (ref 3.5–5.1)
Sodium: 135 mmol/L (ref 135–145)
Total Bilirubin: 0.7 mg/dL (ref 0.3–1.2)
Total Protein: 8.2 g/dL — ABNORMAL HIGH (ref 6.5–8.1)

## 2018-11-28 LAB — TROPONIN I (HIGH SENSITIVITY)
Troponin I (High Sensitivity): 23 ng/L — ABNORMAL HIGH (ref <18)
Troponin I (High Sensitivity): 22 ng/L — ABNORMAL HIGH (ref <18)

## 2018-11-28 LAB — URINALYSIS, COMPLETE (UACMP) WITH MICROSCOPIC
Bacteria, UA: NONE SEEN
Bilirubin Urine: NEGATIVE
Glucose, UA: 50 mg/dL — AB
Hgb urine dipstick: NEGATIVE
Ketones, ur: NEGATIVE mg/dL
Leukocytes,Ua: NEGATIVE
Nitrite: NEGATIVE
Protein, ur: NEGATIVE mg/dL
Specific Gravity, Urine: 1.009 (ref 1.005–1.030)
pH: 5 (ref 5.0–8.0)

## 2018-11-28 LAB — SARS CORONAVIRUS 2 BY RT PCR (HOSPITAL ORDER, PERFORMED IN ~~LOC~~ HOSPITAL LAB): SARS Coronavirus 2: NEGATIVE

## 2018-11-28 LAB — BRAIN NATRIURETIC PEPTIDE: B Natriuretic Peptide: 43 pg/mL (ref 0.0–100.0)

## 2018-11-28 MED ORDER — INFLUENZA VAC A&B SA ADJ QUAD 0.5 ML IM PRSY
0.5000 mL | PREFILLED_SYRINGE | INTRAMUSCULAR | Status: AC
  Administered 2018-12-02: 0.5 mL via INTRAMUSCULAR
  Filled 2018-11-28: qty 0.5

## 2018-11-28 MED ORDER — ONDANSETRON HCL 4 MG/2ML IJ SOLN: 4.0000 mg | Freq: Four times a day (QID) | INTRAMUSCULAR | Status: DC | PRN

## 2018-11-28 MED ORDER — HEPARIN SODIUM (PORCINE) 5000 UNIT/ML IJ SOLN
5000.0000 [IU] | Freq: Three times a day (TID) | INTRAMUSCULAR | Status: DC
  Administered 2018-11-29 – 2018-12-02 (×11): 5000 [IU] via SUBCUTANEOUS
  Filled 2018-11-28 (×11): qty 1

## 2018-11-28 MED ORDER — POTASSIUM CHLORIDE IN NACL 20-0.9 MEQ/L-% IV SOLN
INTRAVENOUS | Status: DC
  Administered 2018-11-29: via INTRAVENOUS
  Filled 2018-11-28 (×2): qty 1000

## 2018-11-28 MED ORDER — ACETAMINOPHEN 325 MG PO TABS: 650.0000 mg | ORAL_TABLET | Freq: Four times a day (QID) | ORAL | Status: DC | PRN

## 2018-11-28 MED ORDER — ACETAMINOPHEN 650 MG RE SUPP: 650.0000 mg | Freq: Four times a day (QID) | RECTAL | Status: DC | PRN

## 2018-11-28 MED ORDER — ATORVASTATIN CALCIUM 20 MG PO TABS
80.0000 mg | ORAL_TABLET | Freq: Every day | ORAL | Status: DC
  Administered 2018-11-29 – 2018-12-01 (×3): 80 mg via ORAL
  Filled 2018-11-28 (×3): qty 4

## 2018-11-28 MED ORDER — FLUTICASONE PROPIONATE 50 MCG/ACT NA SUSP
1.0000 | Freq: Every day | NASAL | Status: DC
  Administered 2018-11-29: 1 via NASAL
  Filled 2018-11-28: qty 16

## 2018-11-28 MED ORDER — ONDANSETRON HCL 4 MG PO TABS: 4.0000 mg | ORAL_TABLET | Freq: Four times a day (QID) | ORAL | Status: DC | PRN

## 2018-11-28 MED ORDER — LACTATED RINGERS IV BOLUS
1000.0000 mL | Freq: Once | INTRAVENOUS | Status: AC
  Administered 2018-11-28: 1000 mL via INTRAVENOUS

## 2018-11-28 MED ORDER — DOCUSATE SODIUM 100 MG PO CAPS
100.0000 mg | ORAL_CAPSULE | Freq: Two times a day (BID) | ORAL | Status: DC
  Administered 2018-11-29 – 2018-12-02 (×7): 100 mg via ORAL
  Filled 2018-11-28 (×8): qty 1

## 2018-11-28 NOTE — Progress Notes (Signed)
Family Meeting Note  Advance Directive:yes  Today a meeting took place with the Patient.  The following clinical team members were present during this meeting:MD  The following were discussed:Patient's diagnosis: Acute kidney injury on chronic kidney disease, chronic history of congestive heart failure, COPD, diabetes mellitus hypertension hyperlipidemia will be admitted to the hospital with AKI.  Creatinine trended up to 4.6.  We will get renal ultrasound.  The plan of care discussed in detail with the patient.  He verbalized understanding of the plan.  , Patient's progosis: Unable to determine and Goals for treatment: Full Code Wife is healthcare power of attorney  Additional follow-up to be provided: Hospitalist, nephrology  Time spent during discussion:17 min  Nicholes Mango, MD

## 2018-11-28 NOTE — ED Triage Notes (Signed)
PT to ED via EMS from home. PT was headed to restroom and had syncopal episode, unknown if hit head as pt just remembers waking up on the floor. PT then became very nauseas. C/O weakness since MI in June. Alert and oriented at this time. PT also states he has been trying to urinate since 2:57pm

## 2018-11-28 NOTE — H&P (Signed)
[name] NAME: Trevor Jennings    MR#:  [id number]  DATE OF BIRTH:  Jul 11, 1940  DATE OF ADMISSION:  11/28/2018  PRIMARY CARE PHYSICIAN: [facility]   REQUESTING/REFERRING PHYSICIAN: [place]  CHIEF COMPLAINT:  Syncope  HISTORY OF PRESENT ILLNESS:  Trevor Jennings  is a 78 y.o. male with a known history of congestive heart failure, COPD, diabetes mellitus, hypertension hyperlipidemia chronic kidney disease status post defibrillator is presenting to the ED after he sustained a syncopal episode and feeling extremely weak.  Patient has baseline chronic kidney disease and today his creatinine is at 4.6.  Patient usually goes to [facility] and recently he was admitted with signs of congestive heart failure and he was given metolazone.  Patient exactly does not know what happened at around 2:57 PM but he had a syncopal episode.  Chest x-ray is negative troponin at 23  PAST MEDICAL HISTORY:   Past Medical History:  Diagnosis Date  . CHF (congestive heart failure) ([place])   . COPD (chronic obstructive pulmonary disease) ([place])   . Diabetes mellitus without complication ([place])   . Hx of CABG 2003  . Hyperlipemia   . Hypertension   . Kidney disease   . 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 Smoking status: Former Smoker    Quit date: 1995    Years since quitting: 25.8  . Smokeless tobacco: Never Used  Substance Use Topics  . Alcohol use: Never    Frequency: Never    FAMILY HISTORY:  No family history on file.  DRUG ALLERGIES:   Allergies  Allergen Reactions  . Penicillins     REVIEW OF SYSTEMS:  CONSTITUTIONAL: No fever, reports fatigue and weakness.  EYES: No blurred or double vision.  EARS,  NOSE, AND THROAT: No tinnitus or ear pain.  RESPIRATORY: No cough, shortness of breath, wheezing or hemoptysis.  CARDIOVASCULAR: No chest pain, orthopnea, edema.  GASTROINTESTINAL: No nausea, vomiting, diarrhea or abdominal pain.  GENITOURINARY: No dysuria, hematuria.  ENDOCRINE: No polyuria, nocturia,  HEMATOLOGY: No anemia, easy bruising or bleeding SKIN: No rash or lesion. MUSCULOSKELETAL: No joint pain or arthritis.   NEUROLOGIC: No tingling, numbness, weakness.  PSYCHIATRY: No anxiety or depression.   MEDICATIONS AT HOME:   Prior to Admission medications   Medication Sig Start Date End Date Taking? Authorizing Provider  albuterol (PROVENTIL HFA;VENTOLIN HFA) 108 (90 Base) MCG/ACT inhaler Inhale into the lungs.    [provider]  aspirin EC 81 MG tablet Take by mouth.    [provider]  atorvastatin (LIPITOR) 80 MG tablet Take by mouth.    [provider]  carvedilol (COREG) 25 MG tablet Take by mouth.    [provider]  Cholecalciferol (VITAMIN D-1000 MAX ST) 1000 units tablet Take by mouth.    [provider]  citalopram (CELEXA) 40 MG tablet Take by mouth.    [provider]  fluticasone (FLONASE) 50 MCG/ACT nasal spray 2 sprays by Each Nare route daily.    [provider]  gabapentin (NEURONTIN) 100 MG capsule Take by mouth.    [provider]  insulin glargine (LANTUS) 100 UNIT/ML injection Inject into the skin.    [provider]  insulin regular (NOVOLIN R,HUMULIN R) 100 units/mL injection Inject into the skin.    [provider]  loratadine (CLARITIN) 10 MG tablet Take 10 mg by mouth daily.    [provider]  pantoprazole (PROTONIX) 40 MG tablet Take by mouth.    [provider]  torsemide (DEMADEX) 20 MG tablet Take by mouth.    [provider]      VITAL SIGNS:  Blood pressure 128/73, pulse 64, temperature 98.3 F (36.8 C), temperature source Oral,  resp. rate 14, height 5\' 10"  (1.778 m), weight 130.9 kg, SpO2 100 %.  PHYSICAL EXAMINATION:  GENERAL:  77 y.o.-year-old patient lying in the bed with no acute distress.  EYES: Pupils equal, round, reactive to light and accommodation. No scleral icterus. Extraocular muscles intact.  HEENT: Head atraumatic, normocephalic. Oropharynx and nasopharynx clear.  NECK:  Supple, no jugular venous distention. No thyroid enlargement, no tenderness.  LUNGS: Normal breath sounds bilaterally, no wheezing, rales,rhonchi or crepitation. No use of accessory muscles of respiration.  CARDIOVASCULAR: S1, S2 normal. No murmurs, rubs, or gallops.  ABDOMEN: Soft, nontender, nondistended. Bowel sounds present. EXTREMITIES: No pedal edema, cyanosis, or clubbing.  NEUROLOGIC: Cranial nerves II through XII are intact. Muscle strength generalized weakness in all extremities. Sensation intact. Gait not checked.  PSYCHIATRIC: The patient is alert and oriented x 3.  SKIN: No obvious rash, lesion, or ulcer.   LABORATORY PANEL:   CBC Recent Labs  Lab 11/28/18 1719  WBC 8.7  HGB 10.5*  HCT 35.7*  PLT 235   ------------------------------------------------------------------------------------------------------------------  Chemistries  Recent Labs  Lab 11/28/18 1719  NA 135  K 3.2*  CL 83*  CO2 32  GLUCOSE 135*  BUN 87*  CREATININE 4.63*  CALCIUM 9.6  AST 23  ALT 27  ALKPHOS 99  BILITOT 0.7   ------------------------------------------------------------------------------------------------------------------  Cardiac Enzymes No results for input(s): TROPONINI in the last 168 hours. ------------------------------------------------------------------------------------------------------------------  RADIOLOGY:  Dg Chest 2 View  Result Date: 11/28/2018 CLINICAL DATA:  Weakness x1 week, worse today. Hx of CHF, COPD, diabetes, HTN. Former smoker. EXAM: CHEST - 2 VIEW COMPARISON:  It is 09/13/2017 FINDINGS:  LEFT-sided transvenous pacemaker leads overlie the RIGHT atrium, RIGHT ventricle, and coronary sinus. The heart is enlarged and stable in configuration. No pulmonary edema. No focal consolidations. Below fragments overlying the LEFT humeral head. There are extensive degenerative changes in the midthoracic spine. IMPRESSION: Stable cardiomegaly. No evidence for acute pulmonary abnormality. Electronically Signed   By: Nolon Nations M.D.   On: 11/28/2018 17:42    EKG:   Orders placed or performed during the hospital encounter of 11/28/18  . ED EKG  . 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  At this point patient is reporting that he is urinating well Avoid nephrotoxins Nephrology consult placed and notify Dr. Juleen China  #Syncope-probably from dehydration from [place] with IV fluids Cycle troponins Check orthostatics PT evaluation Status post pacemaker interrogation in the ED-no abnormalities identified  #Obstructive sleep apnea CPAP nightly  #Diabetes mellitus sliding scale insulin and carb modified diet  #Essential hypertension continue home medication Coreg and hold off on the diuretics   DVT prophylaxis with heparin subcu  All the records are reviewed and case discussed with ED provider.  Management plans discussed with the patient, family and they are in agreement.  CODE STATUS: fc   TOTAL TIME TAKING CARE OF THIS PATIENT: 45  minutes.   Note: This dictation was prepared with Dragon dictation along with smaller phrase technology. Any transcriptional errors that result from this process are unintentional.  Nicholes Mango M.D on 11/28/2018 at 9:59 PM  Between 7am to 6pm - Pager - [phone]  After 6pm go to [email or web address] -  password [email or web address] [facility] [facility]  Office  [phone]  CC: Primary care physician; [facility] [place]

## 2018-11-28 NOTE — ED Provider Notes (Signed)
[facility] Emergency Department Provider Note   ____________________________________________   First MD Initiated Contact with Patient 11/28/18 1700     (approximate)  I have reviewed the triage vital signs and the nursing notes.   HISTORY  Chief Complaint Loss of Consciousness and Weakness    HPI Trevor Jennings is a 78 y.o. male with past medical history of CHF, diabetes, hypertension, and V. tach status post AICD presents to the ED following syncopal episode.  Patient reports that he was recently discharged from the [place] hospital at the end of last week following admission for CHF exacerbation.  He was started on metolazone at that time and states he has been feeling increasingly weak since then.  His wife believes he may have lost up to 30 pounds and she was concerned that he was getting dehydrated and stopped all diuretics 2 days ago.  She states his urinary output has been decreased since then, but he has continued to feel weak.  He attempted to go to the bathroom earlier today and began to feel very lightheaded, had to lower himself to the ground before passing out.  His wife states she found him laying with his back on the bathroom floor and he was able to regain consciousness over about 5 seconds.  Upon arrival to the ED, he states he continues to feel very weak but denies any chest pain or shortness of breath.  He has not had any recent fevers, cough, or shortness of breath.        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 Authorizing Provider  albuterol (PROVENTIL HFA;VENTOLIN HFA) 108 (90 Base) MCG/ACT inhaler Inhale into the lungs.    [provider]  aspirin EC 81 MG tablet Take by mouth.    [provider]  atorvastatin (LIPITOR) 80 MG tablet Take by mouth.    [provider]  carvedilol (COREG) 25 MG tablet Take by mouth.    [provider]  Cholecalciferol (VITAMIN D-1000 MAX ST) 1000 units tablet Take by mouth.    [provider]  citalopram (CELEXA) 40 MG tablet Take by mouth.    [provider]  fluticasone (FLONASE) 50 MCG/ACT nasal spray 2 sprays by Each Nare route daily.    [provider]  gabapentin (NEURONTIN) 100 MG capsule Take by mouth.    [provider]  insulin glargine (LANTUS) 100 UNIT/ML injection Inject into the skin.    [provider]  insulin regular (NOVOLIN R,HUMULIN R) 100 units/mL injection Inject into the skin.    [provider]  loratadine (CLARITIN) 10 MG tablet Take 10 mg by mouth daily.  [provider]  pantoprazole (PROTONIX) 40 MG tablet Take by mouth.    [provider]  torsemide (DEMADEX) 20 MG tablet Take by mouth.    [provider]    Allergies Penicillins  No family history on file.  Social History Social History   Tobacco Use   Smoking status: Former Smoker    Quit date: 1995    Years since quitting: 25.8   Smokeless tobacco: Never Used  Substance Use Topics   Alcohol use: Never    Frequency: Never   Drug use: Never    Review of  Systems  Constitutional: No fever/chills.  Positive for lightheadedness and generalized weakness. Eyes: No visual changes. ENT: No sore throat. Cardiovascular: Denies chest pain.  Positive for syncope. Respiratory: Denies shortness of breath. Gastrointestinal: No abdominal pain.  No nausea, no vomiting.  No diarrhea.  No constipation. Genitourinary: Negative for dysuria. Musculoskeletal: Negative for back pain. Skin: Negative for rash. Neurological: Negative for headaches, focal weakness or numbness.  ____________________________________________   PHYSICAL EXAM:  VITAL SIGNS: ED Triage Vitals  Enc Vitals Group     BP      Pulse      Resp      Temp      Temp src      SpO2      Weight      Height      Head Circumference      Peak Flow      Pain Score      Pain Loc      Pain Edu?      Excl. in [place]?     Constitutional: Alert and oriented. Eyes: Conjunctivae are normal. Head: Atraumatic. Nose: No congestion/rhinnorhea. Mouth/Throat: Mucous membranes are moist. Neck: Normal ROM Cardiovascular: Normal rate, regular rhythm. Grossly normal heart sounds. Respiratory: Normal respiratory effort.  No retractions. Lungs CTAB. Gastrointestinal: Soft and nontender. No distention. Genitourinary: deferred Musculoskeletal: No lower extremity tenderness nor edema. Neurologic:  Normal speech and language. No gross focal neurologic deficits are appreciated. Skin:  Skin is warm, dry and intact. No rash noted.  Pale appearing. Psychiatric: Mood and affect are normal. Speech and behavior are normal.  ____________________________________________   LABS (all labs ordered are listed, but only abnormal results are displayed)  Labs Reviewed  COMPREHENSIVE METABOLIC PANEL - Abnormal; Notable for the following components:      Result Value   Potassium 3.2 (*)    Chloride 83 (*)    Glucose, Bld 135 (*)    BUN 87 (*)    Creatinine, Ser 4.63 (*)    Total Protein 8.2 (*)    GFR calc non  Af Amer 11 (*)    GFR calc Af Amer 13 (*)    Anion gap 20 (*)    All other components within normal limits  CBC WITH DIFFERENTIAL/PLATELET - Abnormal; Notable for the following components:   Hemoglobin 10.5 (*)    HCT 35.7 (*)    MCV 78.8 (*)    MCH 23.2 (*)    MCHC 29.4 (*)    RDW 20.7 (*)    All other components within normal limits  URINALYSIS, COMPLETE (UACMP) WITH MICROSCOPIC - Abnormal; Notable for the following components:   Color, Urine STRAW (*)    APPearance CLEAR (*)    Glucose, UA 50 (*)    All other components within normal limits  TROPONIN I (HIGH SENSITIVITY) - Abnormal; Notable for the following components:   Troponin I (High Sensitivity)  23 (*)    All other components within normal limits  SARS CORONAVIRUS 2 BY RT PCR (HOSPITAL ORDER, [place] LAB)  BRAIN NATRIURETIC PEPTIDE   ____________________________________________  EKG  ED ECG REPORT I, Blake Divine, the attending physician, personally viewed and interpreted this ECG.   Date: 11/28/2018  EKG Time: 11:47  Rate: 60  Rhythm: AV sequential pacing  Axis: LAD  Intervals:Ventricularly paced  ST&T Change: Negative [place]    PROCEDURES  Procedure(s) performed (including Critical Care):  Procedures   ____________________________________________   INITIAL IMPRESSION / ASSESSMENT AND PLAN / ED COURSE       78 year old male with history of CHF, diabetes, hypertension, and V. tach status post AICD presents to the ED following increasing generalized weakness and syncopal episode earlier today.  Patient continues to feel very weak but did not have any chest pain or shortness of breath with the syncopal episode.  EKG shows dual sequential pacer. Pacemaker was interrogated and did not record any events of arrhythmia since last check earlier this month.  It did reportedly detect changes in fluid impedance in patient's right ventricle consistent with dehydration.  Given he was  recently started on a new diuretic, it is possible that he has been over diuresed.  We will recheck kidney function and electrolytes, also assess for possible source of infection although patient denies any infectious symptoms.  UA and chest x-ray negative for infection.  Labs show AKI with significant elevation in BUN and creatinine, suspect this is due to overdiuresis given his recent admission for CHF and addition of metolazone to his diuretic regimen.  Will give IV fluid bolus and plan for admission.  Patient and family prefer for him to be admitted to the [place], will contact her in the [place] for potential transfer.  [facility]  He will be kept on the list for possible transfer, but they are unable to provide a timeframe for a bed opening and at this time it appears patient would be best served by being admitted here.  Patient and family are agreeable to this and case was discussed with hospitalist, who accepts patient for admission.      ____________________________________________   FINAL CLINICAL IMPRESSION(S) / ED DIAGNOSES  Final diagnoses:  AKI (acute kidney injury) ([place])  Dehydration  Chronic systolic congestive heart failure [facility])     ED Discharge Orders    None       Note:  This document was prepared using Dragon voice recognition software and may include unintentional dictation errors.   Blake Divine, MD 11/28/18 [phone]

## 2018-11-28 NOTE — ED Notes (Signed)
Patient transported to X-ray 

## 2018-11-29 ENCOUNTER — Inpatient Hospital Stay: Payer: MEDICARE

## 2018-11-29 LAB — COMPREHENSIVE METABOLIC PANEL
ALT: 25 U/L (ref 0–44)
AST: 21 U/L (ref 15–41)
Albumin: 3.8 g/dL (ref 3.5–5.0)
Alkaline Phosphatase: 92 U/L (ref 38–126)
Anion gap: 17 — ABNORMAL HIGH (ref 5–15)
BUN: 83 mg/dL — ABNORMAL HIGH (ref 8–23)
CO2: 33 mmol/L — ABNORMAL HIGH (ref 22–32)
Calcium: 9.5 mg/dL (ref 8.9–10.3)
Chloride: 86 mmol/L — ABNORMAL LOW (ref 98–111)
Creatinine, Ser: 4.11 mg/dL — ABNORMAL HIGH (ref 0.61–1.24)
GFR calc Af Amer: 15 mL/min — ABNORMAL LOW (ref >60)
GFR calc non Af Amer: 13 mL/min — ABNORMAL LOW (ref >60)
Glucose, Bld: 146 mg/dL — ABNORMAL HIGH (ref 70–99)
Potassium: 2.8 mmol/L — ABNORMAL LOW (ref 3.5–5.1)
Sodium: 136 mmol/L (ref 135–145)
Total Bilirubin: 0.8 mg/dL (ref 0.3–1.2)
Total Protein: 7.9 g/dL (ref 6.5–8.1)

## 2018-11-29 LAB — CBC
HCT: 39.1 % (ref 39.0–52.0)
Hemoglobin: 10.9 g/dL — ABNORMAL LOW (ref 13.0–17.0)
MCH: 22.9 pg — ABNORMAL LOW (ref 26.0–34.0)
MCHC: 27.9 g/dL — ABNORMAL LOW (ref 30.0–36.0)
MCV: 82.3 fL (ref 80.0–100.0)
Platelets: 217 10*3/uL (ref 150–400)
RBC: 4.75 MIL/uL (ref 4.22–5.81)
RDW: 21.2 % — ABNORMAL HIGH (ref 11.5–15.5)
WBC: 7.6 10*3/uL (ref 4.0–10.5)
nRBC: 0 % (ref 0.0–0.2)

## 2018-11-29 LAB — TROPONIN I (HIGH SENSITIVITY): Troponin I (High Sensitivity): 25 ng/L — ABNORMAL HIGH (ref <18)

## 2018-11-29 LAB — HEMOGLOBIN A1C
Hgb A1c MFr Bld: 6.4 % — ABNORMAL HIGH (ref 4.8–5.6)
Mean Plasma Glucose: 136.98 mg/dL

## 2018-11-29 LAB — GLUCOSE, CAPILLARY
Glucose-Capillary: 170 mg/dL — ABNORMAL HIGH (ref 70–99)
Glucose-Capillary: 180 mg/dL — ABNORMAL HIGH (ref 70–99)
Glucose-Capillary: 159 mg/dL — ABNORMAL HIGH (ref 70–99)

## 2018-11-29 LAB — POTASSIUM: Potassium: 3.4 mmol/L — ABNORMAL LOW (ref 3.5–5.1)

## 2018-11-29 MED ORDER — COLCHICINE 0.6 MG PO TABS
0.6000 mg | ORAL_TABLET | Freq: Every day | ORAL | Status: DC
  Administered 2018-11-29 – 2018-12-02 (×4): 0.6 mg via ORAL
  Filled 2018-11-29 (×4): qty 1

## 2018-11-29 MED ORDER — PANTOPRAZOLE SODIUM 40 MG PO TBEC
40.0000 mg | DELAYED_RELEASE_TABLET | Freq: Every day | ORAL | Status: DC
  Administered 2018-11-29 – 2018-12-02 (×4): 40 mg via ORAL
  Filled 2018-11-29 (×4): qty 1

## 2018-11-29 MED ORDER — AMIODARONE HCL 200 MG PO TABS
400.0000 mg | ORAL_TABLET | Freq: Every day | ORAL | Status: DC
  Administered 2018-11-29 – 2018-12-02 (×4): 400 mg via ORAL
  Filled 2018-11-29 (×4): qty 2

## 2018-11-29 MED ORDER — POTASSIUM CHLORIDE 10 MEQ/100ML IV SOLN
10.0000 meq | INTRAVENOUS | Status: AC
  Administered 2018-11-29 (×5): 10 meq via INTRAVENOUS
  Filled 2018-11-29 (×5): qty 100

## 2018-11-29 MED ORDER — LIRAGLUTIDE 18 MG/3ML ~~LOC~~ SOPN: 1.2000 mg | PEN_INJECTOR | Freq: Every day | SUBCUTANEOUS | Status: DC

## 2018-11-29 MED ORDER — INSULIN GLARGINE 100 UNIT/ML ~~LOC~~ SOLN
40.0000 [IU] | Freq: Every day | SUBCUTANEOUS | Status: DC
  Administered 2018-11-29 – 2018-12-02 (×4): 40 [IU] via SUBCUTANEOUS
  Filled 2018-11-29 (×4): qty 0.4

## 2018-11-29 MED ORDER — INSULIN ASPART 100 UNIT/ML ~~LOC~~ SOLN
0.0000 [IU] | Freq: Three times a day (TID) | SUBCUTANEOUS | Status: DC
  Administered 2018-11-29 (×2): 2 [IU] via SUBCUTANEOUS
  Administered 2018-11-30: 3 [IU] via SUBCUTANEOUS
  Administered 2018-11-30 – 2018-12-01 (×2): 2 [IU] via SUBCUTANEOUS
  Administered 2018-12-01: 5 [IU] via SUBCUTANEOUS
  Administered 2018-12-01: 2 [IU] via SUBCUTANEOUS
  Administered 2018-12-02: 7 [IU] via SUBCUTANEOUS
  Administered 2018-12-02: 1 [IU] via SUBCUTANEOUS
  Filled 2018-11-29 (×9): qty 1

## 2018-11-29 MED ORDER — GABAPENTIN 100 MG PO CAPS
100.0000 mg | ORAL_CAPSULE | Freq: Three times a day (TID) | ORAL | Status: DC
  Administered 2018-11-29 – 2018-12-02 (×10): 100 mg via ORAL
  Filled 2018-11-29 (×10): qty 1

## 2018-11-29 MED ORDER — POTASSIUM CHLORIDE CRYS ER 20 MEQ PO TBCR
20.0000 meq | EXTENDED_RELEASE_TABLET | Freq: Once | ORAL | Status: AC
  Administered 2018-11-29: 20 meq via ORAL
  Filled 2018-11-29: qty 1

## 2018-11-29 MED ORDER — METOPROLOL TARTRATE 25 MG PO TABS
25.0000 mg | ORAL_TABLET | Freq: Two times a day (BID) | ORAL | Status: DC
  Administered 2018-11-29 – 2018-11-30 (×3): 25 mg via ORAL
  Filled 2018-11-29 (×7): qty 1

## 2018-11-29 MED ORDER — SERTRALINE HCL 50 MG PO TABS
50.0000 mg | ORAL_TABLET | Freq: Every day | ORAL | Status: DC
  Administered 2018-11-29 – 2018-12-02 (×4): 50 mg via ORAL
  Filled 2018-11-29 (×4): qty 1

## 2018-11-29 MED ORDER — ASPIRIN EC 81 MG PO TBEC
81.0000 mg | DELAYED_RELEASE_TABLET | Freq: Every day | ORAL | Status: DC
  Administered 2018-11-29 – 2018-12-02 (×4): 81 mg via ORAL
  Filled 2018-11-29 (×4): qty 1

## 2018-11-29 MED ORDER — POLYVINYL ALCOHOL 1.4 % OP SOLN
1.0000 [drp] | Freq: Four times a day (QID) | OPHTHALMIC | Status: DC
  Filled 2018-11-29: qty 15

## 2018-11-29 MED ORDER — INSULIN ASPART 100 UNIT/ML ~~LOC~~ SOLN
0.0000 [IU] | Freq: Every day | SUBCUTANEOUS | Status: DC
  Administered 2018-12-01: 2 [IU] via SUBCUTANEOUS
  Filled 2018-11-29: qty 1

## 2018-11-29 MED ORDER — HYPROMELLOSE (GONIOSCOPIC) 2.5 % OP SOLN
1.0000 [drp] | Freq: Four times a day (QID) | OPHTHALMIC | Status: DC
  Filled 2018-11-29: qty 15

## 2018-11-29 MED ORDER — CITALOPRAM HYDROBROMIDE 20 MG PO TABS
40.0000 mg | ORAL_TABLET | Freq: Every day | ORAL | Status: DC
  Administered 2018-11-29: 40 mg via ORAL
  Filled 2018-11-29: qty 2

## 2018-11-29 MED ORDER — CARVEDILOL 25 MG PO TABS: 25.0000 mg | ORAL_TABLET | Freq: Two times a day (BID) | ORAL | Status: DC

## 2018-11-29 NOTE — Progress Notes (Signed)
[place] [facility]                                                                                                                                                                                  Patient Demographics   Trevor Jennings, is a 78 y.o. male, DOB - Feb 24, 1940, [id number]  Admit date - 11/28/2018   Admitting Physician Nicholes Mango, MD  Outpatient Primary MD for the patient is [facility] [place]   LOS - 1  Subjective: States that he is feeling better    Review of Systems:   CONSTITUTIONAL: No documented fever.  Positive fatigue, positive weakness. No weight gain, no weight loss.  EYES: No blurry or double vision.  ENT: No tinnitus. No postnasal drip. No redness of the oropharynx.  RESPIRATORY: No cough, no wheeze, no hemoptysis. No dyspnea.  CARDIOVASCULAR: No chest pain. No orthopnea. No palpitations. No syncope.  GASTROINTESTINAL: No nausea, no vomiting or diarrhea. No abdominal pain. No melena or hematochezia.  GENITOURINARY: No dysuria or hematuria.  ENDOCRINE: No polyuria or nocturia. No heat or cold intolerance.  HEMATOLOGY: No anemia. No bruising. No bleeding.  INTEGUMENTARY: No rashes. No lesions.  MUSCULOSKELETAL: No arthritis. No swelling. No gout.  NEUROLOGIC: No numbness, tingling, or ataxia. No seizure-type activity.  PSYCHIATRIC: No anxiety. No insomnia. No ADD.    Vitals:   Vitals:   11/28/18 1923 11/28/18 2123 11/28/18 2241 11/29/18 0418  BP: (!) 123/53 128/73 (!) 107/28 (!) 110/59  Pulse: 60 64 (!) 59 64  Resp: 15 14 20 16   Temp:   97.8 F (36.6 C) 97.9 F (36.6 C)  TempSrc:   Oral Oral  SpO2: 95% 100% 100% 93%  Weight:   130.5 kg   Height:   5\' 10"  (1.778 m)     Wt Readings from Last 3 Encounters:  11/28/18 130.5 kg  12/10/17 (!) 140.8 kg  09/13/17 97.1 kg     Intake/Output Summary (Last 24 hours) at 11/29/2018 1333 Last data filed at 11/29/2018 0900 Gross per 24 hour  Intake 1814.32 ml   Output 800 ml  Net 1014.32 ml    Physical Exam:   GENERAL: Pleasant-appearing in no apparent distress.  HEAD, EYES, EARS, NOSE AND THROAT: Atraumatic, normocephalic. Extraocular muscles are intact. Pupils equal and reactive to light. Sclerae anicteric. No conjunctival injection. No oro-pharyngeal erythema.  NECK: Supple. There is no jugular venous distention. No bruits, no lymphadenopathy, no thyromegaly.  HEART: Regular rate and rhythm,. No murmurs, no rubs, no clicks.  LUNGS: Clear to auscultation bilaterally. No rales or rhonchi. No wheezes.  ABDOMEN: Soft, flat, nontender, nondistended. Has good bowel sounds. No hepatosplenomegaly appreciated.  EXTREMITIES: No evidence of any cyanosis, clubbing, or peripheral edema.  +2 pedal and radial pulses bilaterally.  NEUROLOGIC: The patient is alert, awake, and oriented x3 with no focal motor or sensory deficits appreciated bilaterally.  SKIN: Moist and warm with no rashes appreciated.  Psych: Not anxious, depressed LN: No inguinal LN enlargement    Antibiotics   Anti-infectives (From admission, onward)   None      Medications   Scheduled Meds: . amiodarone  400 mg Oral Daily  . aspirin EC  81 mg Oral Daily  . atorvastatin  80 mg Oral q1800  . colchicine  0.6 mg Oral Daily  . docusate sodium  100 mg Oral BID  . fluticasone  1 spray Each Nare Daily  . gabapentin  100 mg Oral TID  . heparin  5,000 Units Subcutaneous Q8H  . influenza vaccine adjuvanted  0.5 mL Intramuscular Tomorrow-1000  . insulin aspart  0-5 Units Subcutaneous QHS  . insulin aspart  0-9 Units Subcutaneous TID WC  . insulin glargine  40 Units Subcutaneous Daily  . metoprolol tartrate  25 mg Oral BID  . pantoprazole  40 mg Oral Daily  . polyvinyl alcohol  1 drop Both Eyes QID  . sertraline  50 mg Oral Daily   Continuous Infusions: PRN Meds:.acetaminophen **OR** acetaminophen, ondansetron **OR** ondansetron (ZOFRAN) IV   Data Review:   Micro  Results Recent Results (from the past 240 hour(s))  SARS Coronavirus 2 by RT PCR (hospital order, performed in [place] hospital lab) Nasopharyngeal Nasopharyngeal Swab     Status: None   Collection Time: 11/28/18  6:23 PM   Specimen: Nasopharyngeal Swab  Result Value Ref Range Status   SARS Coronavirus 2 NEGATIVE NEGATIVE Final    Comment: (NOTE) If result is NEGATIVE SARS-CoV-2 target nucleic acids are NOT DETECTED. The SARS-CoV-2 RNA is generally detectable in upper and lower  respiratory specimens during the acute phase of infection. The lowest  concentration of SARS-CoV-2 viral copies this assay can detect is 250  copies / mL. A negative result does not preclude SARS-CoV-2 infection  and should not be used as the sole basis for treatment or other  patient management decisions.  A negative result may occur with  improper specimen collection / handling, submission of specimen other  than nasopharyngeal swab, presence of viral mutation(s) within the  areas targeted by this assay, and inadequate number of viral copies  (<250 copies / mL). A negative result must be combined with clinical  observations, patient history, and epidemiological information. If result is POSITIVE SARS-CoV-2 target nucleic acids are DETECTED. The SARS-CoV-2 RNA is generally detectable in upper and lower  respiratory specimens dur ing the acute phase of infection.  Positive  results are indicative of active infection with SARS-CoV-2.  Clinical  correlation with patient history and other diagnostic information is  necessary to determine patient infection status.  Positive results do  not rule out bacterial infection or co-infection with other viruses. If result is PRESUMPTIVE POSTIVE SARS-CoV-2 nucleic acids MAY BE PRESENT.   A presumptive positive result was obtained on the submitted specimen  and confirmed on repeat testing.  While 2019 novel coronavirus  (SARS-CoV-2) nucleic acids may be present in the  submitted sample  additional confirmatory testing may be necessary for epidemiological  and / or clinical management purposes  to differentiate between  SARS-CoV-2 and other Sarbecovirus currently known to infect humans.  If clinically indicated additional testing with an alternate test  methodology [phone]) is  advised. The SARS-CoV-2 RNA is generally  detectable in upper and lower respiratory sp ecimens during the acute  phase of infection. The expected result is Negative. Fact Sheet for Patients:  StrictlyIdeas.no Fact Sheet for Healthcare Providers: BankingDealers.co.za This test is not yet approved or cleared by the [place] FDA and has been authorized for detection and/or diagnosis of SARS-CoV-2 by FDA under an Emergency Use Authorization (EUA).  This EUA will remain in effect (meaning this test can be used) for the duration of the COVID-19 declaration under Section 564(b)(1) of the Act, 21 U.S.C. section 360bbb-3(b)(1), unless the authorization is terminated or revoked sooner. Performed at [facility], [address] [place], [place] [address]     Radiology Reports Dg Chest 2 View  Result Date: 11/28/2018 CLINICAL DATA:  Weakness x1 week, worse today. Hx of CHF, COPD, diabetes, HTN. Former smoker. EXAM: CHEST - 2 VIEW COMPARISON:  It is 09/13/2017 FINDINGS: LEFT-sided transvenous pacemaker leads overlie the RIGHT atrium, RIGHT ventricle, and coronary sinus. The heart is enlarged and stable in configuration. No pulmonary edema. No focal consolidations. Below fragments overlying the LEFT humeral head. There are extensive degenerative changes in the midthoracic spine. IMPRESSION: Stable cardiomegaly. No evidence for acute pulmonary abnormality. Electronically Signed   By: Nolon Nations M.D.   On: 11/28/2018 17:42   US Renal  Result Date: 11/29/2018 CLINICAL DATA:  Acute kidney injury EXAM: RENAL / URINARY TRACT  ULTRASOUND COMPLETE COMPARISON:  None. FINDINGS: Right Kidney: Renal measurements: 9.6 x 6.0 x 5.0 cm = volume: 152 mL. 2 cm exophytic cyst off the midpole. No hydronephrosis. Normal echotexture. Left Kidney: Renal measurements: 10.6 x 5.2 x 5.4 cm = volume: 154 mL. Echogenicity within normal limits. No mass or hydronephrosis visualized. Bladder: Appears normal for degree of bladder distention. Other: None IMPRESSION: No acute findings.  No hydronephrosis. Electronically Signed   By: Rolm Baptise M.D.   On: 11/29/2018 08:54     CBC Recent Labs  Lab 11/28/18 1719 11/29/18 0351  WBC 8.7 7.6  HGB 10.5* 10.9*  HCT 35.7* 39.1  PLT 235 217  MCV 78.8* 82.3  MCH 23.2* 22.9*  MCHC 29.4* 27.9*  RDW 20.7* 21.2*  LYMPHSABS 1.9  --   MONOABS 0.9  --   EOSABS 0.1  --   BASOSABS 0.1  --     Chemistries  Recent Labs  Lab 11/28/18 1719 11/29/18 0351  NA 135 136  K 3.2* 2.8*  CL 83* 86*  CO2 32 33*  GLUCOSE 135* 146*  BUN 87* 83*  CREATININE 4.63* 4.11*  CALCIUM 9.6 9.5  AST 23 21  ALT 27 25  ALKPHOS 99 92  BILITOT 0.7 0.8   ------------------------------------------------------------------------------------------------------------------ estimated creatinine clearance is 20.1 mL/min (A) (by C-G formula based on SCr of 4.11 mg/dL (H)). ------------------------------------------------------------------------------------------------------------------ Recent Labs    11/29/18 0955  HGBA1C 6.4*   ------------------------------------------------------------------------------------------------------------------ No results for input(s): CHOL, HDL, LDLCALC, TRIG, CHOLHDL, LDLDIRECT in the last 72 hours. ------------------------------------------------------------------------------------------------------------------ No results for input(s): TSH, T4TOTAL, T3FREE, THYROIDAB in the last 72 hours.  Invalid input(s):  FREET3 ------------------------------------------------------------------------------------------------------------------ No results for input(s): VITAMINB12, FOLATE, FERRITIN, TIBC, IRON, RETICCTPCT in the last 72 hours.  Coagulation profile No results for input(s): INR, PROTIME in the last 168 hours.  No results for input(s): DDIMER in the last 72 hours.  Cardiac Enzymes No results for input(s): CKMB, TROPONINI, MYOGLOBIN in the last 168 hours.  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-dyspnea related to overdiuresis due to recent hospitalization for congestive heart failure Continue IV fluids there is some improvement in renal function Renal ultrasound showed no hydronephrosis   #Syncope- This is related to dehydration likely orthostatic hypotension which were not checked in the emergency room I will check orthostatic hypotension Continue to monitor patient  #Obstructive sleep apnea CPAP nightly  #Diabetes mellitus sliding scale insulin and carb modified diet will resume his home insulin  #Essential hypertension continue home medication Coreg and hold off on the diuretics  #History of CHF type unknown diuresis on hold  #Hyperlipidemia will continue his current Lipitor      Code Status Orders  (From admission, onward)         Start     Ordered   11/28/18 2219  Full code  Continuous     11/28/18 2218        Code Status History    This patient has a current code status but no historical code status.   Advance Care Planning Activity    Advance Directive Documentation     Most Recent Value  Type of Advance Directive  Healthcare Power of Attorney, Living will  Pre-existing out of facility DNR order (yellow form or pink MOST form)  -  "MOST" Form in Place?  -            Consults  nephrology  DVT Prophylaxis continue heparin  Lab Results  Component Value Date   PLT 217 11/29/2018     Time Spent in minutes   35 minutes  Greater than 50% of time spent in care coordination and counseling patient regarding the condition and plan of care.   Dustin Flock M.D on 11/29/2018 at 1:33 PM  Between 7am to 6pm - Pager - [phone]  After 6pm go to [email or web address] - Proofreader  [employer]   Office  [phone]

## 2018-11-29 NOTE — Consult Note (Addendum)
PHARMACY CONSULT NOTE - FOLLOW UP  Pharmacy Consult for Electrolyte Monitoring and Replacement   Recent Labs: Potassium (mmol/L)  Date Value  11/29/2018 3.4 (L)  05/19/2012 5.3 (H)   Magnesium (mg/dL)  Date Value  09/13/2017 2.1   Calcium (mg/dL)  Date Value  11/29/2018 9.5   Calcium, Total (mg/dL)  Date Value  05/19/2012 8.3 (L)   Albumin (g/dL)  Date Value  11/29/2018 3.8   Sodium (mmol/L)  Date Value  11/29/2018 136  05/19/2012 139     Assessment: Pharmacy has been consulted to monitor and replace Electrolytes in 78yo patient suffering from AKI.  K @1752  3.4  Goal of Therapy:  Electrolytes WNL  Plan:  Patient was started on KCl 108mEq in NS at 62ml/hr x 24 hours.  Will continue current continuous infusion and order KCl 20 mEq PO x 1 dose.    Will order repeat labs including Magnesium and Phosphorous tomorrow with AM labs.  Rowland Lathe ,PharmD Clinical Pharmacist 11/29/2018 6:48 PM

## 2018-11-29 NOTE — Consult Note (Signed)
PHARMACY CONSULT NOTE - FOLLOW UP  Pharmacy Consult for Electrolyte Monitoring and Replacement   Recent Labs: Potassium (mmol/L)  Date Value  11/29/2018 2.8 (L)  05/19/2012 5.3 (H)   Magnesium (mg/dL)  Date Value  09/13/2017 2.1   Calcium (mg/dL)  Date Value  11/29/2018 9.5   Calcium, Total (mg/dL)  Date Value  05/19/2012 8.3 (L)   Albumin (g/dL)  Date Value  11/29/2018 3.8   Sodium (mmol/L)  Date Value  11/29/2018 136  05/19/2012 139     Assessment: Pharmacy has been consulted to monitor and replace Electrolytes in 78yo patient suffering from AKI.  Goal of Therapy:  Electrolytes WNL  Plan:  Patient was started on KCl 4mEq in NS at 54ml/hr x 24 hours. During that time, the potassium level decreased form 3.2 to 2.8 Will continue current continuous infusion and order KCl 31mEq IV x 6 doses.   Will recheck K level by 1800 today.  Will order repeat labs including Magnesium and Phosphorous tomorrow with AM labs.  Pearla Dubonnet ,PharmD Clinical Pharmacist 11/29/2018 7:46 AM

## 2018-11-29 NOTE — Consult Note (Signed)
[facility]  CONSULT NOTE    Date: 11/29/2018                  Patient Name:  Trevor Jennings  MRN: [id number]  DOB: 26-May-1940  Age / Sex: 78 y.o., male         PCP: [facility]                 Service Requesting Consult: Dr. Dustin Flock                 Reason for Consult: Acute renal failure            History of Present Illness: [name] was recently discharged from the [place] where he was being treated for acute exacerbation of congestive heart failure. He was discharged with a new medication, metolazone.   Patient fell at home and lost consciousness. He states that he has felt weak and tired since starting this new medication.    Medications: Outpatient medications: Medications Prior to Admission  Medication Sig Dispense Refill Last Dose  . albuterol (PROVENTIL HFA;VENTOLIN HFA) 108 (90 Base) MCG/ACT inhaler Inhale into the lungs.   Past Week at Unknown time  . amiodarone (PACERONE) 200 MG tablet Take 400 mg by mouth daily.   Past Week at Unknown time  . aspirin EC 81 MG tablet Take by mouth.   Past Week at Unknown time  . atorvastatin (LIPITOR) 80 MG tablet Take by mouth.   Past Week at Unknown time  . chlorhexidine (PERIDEX) 0.12 % solution Use as directed 15 mLs in the mouth or throat 2 (two) times daily.   Past Week at Unknown time  . Cholecalciferol (VITAMIN D-1000 MAX ST) 1000 units tablet Take by mouth.   Past Week at Unknown time  . colchicine 0.6 MG tablet Take 0.6 mg by mouth daily. Take 2 tablets at onset of gout attack, then take 1 tablet by mouth daily.   Past Week at Unknown time  . fluticasone (FLONASE) 50 MCG/ACT nasal spray 2 sprays by Each Nare route daily.   Past Week at Unknown time  . hydroxypropyl methylcellulose / hypromellose (ISOPTO TEARS / GONIOVISC) 2.5 % ophthalmic solution Place 1 drop into both eyes 4 (four) times daily.   Past Week at Unknown time  . insulin glargine (LANTUS) 100 UNIT/ML injection  Inject 40 Units into the skin at bedtime.    Past Week at Unknown time  . liraglutide (VICTOZA) 18 MG/3ML SOPN Inject 1.2 mg into the skin daily.   Past Week at Unknown time  . loratadine (CLARITIN) 10 MG tablet Take 10 mg by mouth daily.   Past Week at Unknown time  . metolazone (ZAROXOLYN) 2.5 MG tablet Take 2.5 mg by mouth daily.   Past Week at Unknown time  . metoprolol tartrate (LOPRESSOR) 25 MG tablet Take 25 mg by mouth 2 (two) times daily.   Past Week at Unknown time  . nitroGLYCERIN (NITROSTAT) 0.4 MG SL tablet Place 0.4 mg under the tongue every 5 (five) minutes as needed for chest pain. Take 1 tablet as needed for chest pain. Do not exceed 3 tabs/15 minutes.   Past Week at Unknown time  . pantoprazole (PROTONIX) 40 MG tablet Take by mouth.   Past Week at Unknown time  . sennosides-docusate sodium (SENOKOT-S) 8.6-50 MG tablet Take 1 tablet by mouth 2 (two) times daily.   Past Week at Unknown time  . sertraline (ZOLOFT) 100 MG  tablet Take 50 mg by mouth daily.   Past Week at Unknown time  . spironolactone (ALDACTONE) 25 MG tablet Take 12.5 mg by mouth daily.   Past Week at Unknown time  . torsemide (DEMADEX) 20 MG tablet Take 80 mg by mouth 2 (two) times daily.    Past Week at Unknown time    Current medications: Current Facility-Administered Medications  Medication Dose Route Frequency Provider Last Rate Last Dose  . acetaminophen (TYLENOL) tablet 650 mg  650 mg Oral Q6H PRN Gouru, Aruna, MD       Or  . acetaminophen (TYLENOL) suppository 650 mg  650 mg Rectal Q6H PRN Gouru, Aruna, MD      . amiodarone (PACERONE) tablet 400 mg  400 mg Oral Daily Dustin Flock, MD   400 mg at 11/29/18 1227  . aspirin EC tablet 81 mg  81 mg Oral Daily Dustin Flock, MD   81 mg at 11/29/18 1027  . atorvastatin (LIPITOR) tablet 80 mg  80 mg Oral q1800 Gouru, Aruna, MD      . colchicine tablet 0.6 mg  0.6 mg Oral Daily Dustin Flock, MD   0.6 mg at 11/29/18 1227  . docusate sodium (COLACE) capsule  100 mg  100 mg Oral BID Gouru, Aruna, MD   100 mg at 11/29/18 1027  . fluticasone (FLONASE) 50 MCG/ACT nasal spray 1 spray  1 spray Each Nare Daily Gouru, Aruna, MD   1 spray at 11/29/18 1027  . gabapentin (NEURONTIN) capsule 100 mg  100 mg Oral TID Dustin Flock, MD   100 mg at 11/29/18 1027  . heparin injection 5,000 Units  5,000 Units Subcutaneous Q8H Gouru, Illene Silver, MD   5,000 Units at 11/29/18 1348  . influenza vaccine adjuvanted (FLUAD) injection 0.5 mL  0.5 mL Intramuscular Tomorrow-1000 Gouru, Aruna, MD      . insulin aspart (novoLOG) injection 0-5 Units  0-5 Units Subcutaneous QHS Dustin Flock, MD      . insulin aspart (novoLOG) injection 0-9 Units  0-9 Units Subcutaneous TID WC Dustin Flock, MD   2 Units at 11/29/18 1227  . insulin glargine (LANTUS) injection 40 Units  40 Units Subcutaneous Daily Dustin Flock, MD   40 Units at 11/29/18 1227  . metoprolol tartrate (LOPRESSOR) tablet 25 mg  25 mg Oral BID Dustin Flock, MD   25 mg at 11/29/18 1227  . ondansetron (ZOFRAN) tablet 4 mg  4 mg Oral Q6H PRN Gouru, Aruna, MD       Or  . ondansetron (ZOFRAN) injection 4 mg  4 mg Intravenous Q6H PRN Gouru, Aruna, MD      . pantoprazole (PROTONIX) EC tablet 40 mg  40 mg Oral Daily Dustin Flock, MD   40 mg at 11/29/18 1027  . polyvinyl alcohol (LIQUIFILM TEARS) 1.4 % ophthalmic solution 1 drop  1 drop Both Eyes QID Dustin Flock, MD      . potassium chloride 10 mEq in 100 mL IVPB  10 mEq Intravenous Q1 Hr x 6 Nazari, Walid A, RPH 50 mL/hr at 11/29/18 1410 10 mEq at 11/29/18 1410  . sertraline (ZOLOFT) tablet 50 mg  50 mg Oral Daily Dustin Flock, MD   50 mg at 11/29/18 1227      Allergies: Allergies  Allergen Reactions  . Penicillins       Past Medical History: Past Medical History:  Diagnosis Date  . CHF (congestive heart failure) ([place])   . COPD (chronic obstructive pulmonary disease) ([place])   . Diabetes  mellitus without complication ([place])   . Hx of CABG 2003  .  Hyperlipemia   . Hypertension   . Kidney disease   . 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 AORTIC VALVE REPLACEMENT (AVR)/CORONARY ARTERY BYPASS GRAFTING (CABG)       Family History: History reviewed. No pertinent family history.   Social History: Social History   Socioeconomic History  . Marital status: Married    Spouse name: Not on file  . Number of children: Not on file  . Years of education: Not on file  . Highest education level: Not on file  Occupational History  . Not on file  Social Needs  . Financial resource strain: Not on file  . Food insecurity    Worry: Not on file    Inability: Not on file  . Transportation needs    Medical: Not on file    Non-medical: Not on file  Tobacco Use  . Smoking status: Former Smoker    Quit date: 1995    Years since quitting: 25.8  . Smokeless tobacco: Never Used  Substance and Sexual Activity  . Alcohol use: Never    Frequency: Never  . Drug use: Never  . Sexual activity: Never  Lifestyle  . Physical activity    Days per week: Not on file    Minutes per session: Not on file  . Stress: Not on file  Relationships  . Social Herbalist on phone: Not on file    Gets together: Not on file    Attends religious service: Not on file    Active member of club or organization: Not on file    Attends meetings of clubs or organizations: Not on file    Relationship status: Not on file  . Intimate partner violence    Fear of current or ex partner: Not on file    Emotionally abused: Not on file    Physically abused: Not on file    Forced sexual activity: Not on file  Other Topics Concern  . Not on file  Social History Narrative  . Not on file     Review of Systems: Review of Systems  Constitutional: Positive for malaise/fatigue and weight loss. Negative for chills, diaphoresis and fever.  HENT: Negative for congestion, ear discharge,  ear pain, hearing loss, nosebleeds, sinus pain, sore throat and tinnitus.   Eyes: Negative.  Negative for blurred vision, double vision, photophobia, pain, discharge and redness.  Respiratory: Negative.  Negative for cough, hemoptysis, sputum production, shortness of breath, wheezing and stridor.   Cardiovascular: Negative.  Negative for chest pain, palpitations, orthopnea, claudication, leg swelling and PND.  Gastrointestinal: Negative.  Negative for abdominal pain, blood in stool, constipation, diarrhea, heartburn, melena, nausea and vomiting.  Genitourinary: Negative.  Negative for dysuria, flank pain, frequency, hematuria and urgency.  Musculoskeletal: Negative.  Negative for back pain, falls, joint pain, myalgias and neck pain.  Skin: Negative for itching and rash.  Neurological: Negative.  Negative for dizziness, tingling, tremors, sensory change, speech change, focal weakness, seizures, loss of consciousness, weakness and headaches.  Endo/Heme/Allergies: Negative.  Negative for environmental allergies and polydipsia. Does not bruise/bleed easily.  Psychiatric/Behavioral: Negative.  Negative for depression, hallucinations, memory loss, substance abuse and suicidal ideas. The patient is not nervous/anxious and does not have insomnia.     Vital Signs: Blood pressure (!) 110/59, pulse 64, temperature 97.9 F (36.6 C),  temperature source Oral, resp. rate 16, height 5\' 10"  (1.778 m), weight 130.5 kg, SpO2 93 %.  Weight trends: Filed Weights   11/28/18 1707 11/28/18 2241  Weight: 130.9 kg 130.5 kg    Physical Exam: General: NAD,   Head: Normocephalic, atraumatic. Moist oral mucosal membranes  Eyes: Anicteric, PERRL  Neck: Supple, trachea midline  Lungs:  Clear to auscultation  Heart: Regular rate and rhythm  Abdomen:  Soft, nontender,   Extremities:  no peripheral edema.  Neurologic: Nonfocal, moving all four extremities  Skin: No lesions         Lab results: Basic Metabolic  Panel: Recent Labs  Lab 11/28/18 1719 11/29/18 0351  NA 135 136  K 3.2* 2.8*  CL 83* 86*  CO2 32 33*  GLUCOSE 135* 146*  BUN 87* 83*  CREATININE 4.63* 4.11*  CALCIUM 9.6 9.5    Liver Function Tests: Recent Labs  Lab 11/28/18 1719 11/29/18 0351  AST 23 21  ALT 27 25  ALKPHOS 99 92  BILITOT 0.7 0.8  PROT 8.2* 7.9  ALBUMIN 4.0 3.8   No results for input(s): LIPASE, AMYLASE in the last 168 hours. No results for input(s): AMMONIA in the last 168 hours.  CBC: Recent Labs  Lab 11/28/18 1719 11/29/18 0351  WBC 8.7 7.6  NEUTROABS 5.7  --   HGB 10.5* 10.9*  HCT 35.7* 39.1  MCV 78.8* 82.3  PLT 235 217    Cardiac Enzymes: No results for input(s): CKTOTAL, CKMB, CKMBINDEX, TROPONINI in the last 168 hours.  BNP: Invalid input(s): POCBNP  CBG: Recent Labs  Lab 11/29/18 1137  GLUCAP 170*    Microbiology: Results for orders placed or performed during the hospital encounter of 11/28/18  SARS Coronavirus 2 by RT PCR (hospital order, performed in [facility] hospital lab) Nasopharyngeal Nasopharyngeal Swab     Status: None   Collection Time: 11/28/18  6:23 PM   Specimen: Nasopharyngeal Swab  Result Value Ref Range Status   SARS Coronavirus 2 NEGATIVE NEGATIVE Final    Comment: (NOTE) If result is NEGATIVE SARS-CoV-2 target nucleic acids are NOT DETECTED. The SARS-CoV-2 RNA is generally detectable in upper and lower  respiratory specimens during the acute phase of infection. The lowest  concentration of SARS-CoV-2 viral copies this assay can detect is 250  copies / mL. A negative result does not preclude SARS-CoV-2 infection  and should not be used as the sole basis for treatment or other  patient management decisions.  A negative result may occur with  improper specimen collection / handling, submission of specimen other  than nasopharyngeal swab, presence of viral mutation(s) within the  areas targeted by this assay, and inadequate number of viral copies  (<250  copies / mL). A negative result must be combined with clinical  observations, patient history, and epidemiological information. If result is POSITIVE SARS-CoV-2 target nucleic acids are DETECTED. The SARS-CoV-2 RNA is generally detectable in upper and lower  respiratory specimens dur ing the acute phase of infection.  Positive  results are indicative of active infection with SARS-CoV-2.  Clinical  correlation with patient history and other diagnostic information is  necessary to determine patient infection status.  Positive results do  not rule out bacterial infection or co-infection with other viruses. If result is PRESUMPTIVE POSTIVE SARS-CoV-2 nucleic acids MAY BE PRESENT.   A presumptive positive result was obtained on the submitted specimen  and confirmed on repeat testing.  While 2019 novel coronavirus  (SARS-CoV-2) nucleic acids may be  present in the submitted sample  additional confirmatory testing may be necessary for epidemiological  and / or clinical management purposes  to differentiate between  SARS-CoV-2 and other Sarbecovirus currently known to infect humans.  If clinically indicated additional testing with an alternate test  methodology [id number]) is advised. The SARS-CoV-2 RNA is generally  detectable in upper and lower respiratory sp ecimens during the acute  phase of infection. The expected result is Negative. Fact Sheet for Patients:  StrictlyIdeas.no Fact Sheet for Healthcare Providers: BankingDealers.co.za This test is not yet approved or cleared by the [place] FDA and has been authorized for detection and/or diagnosis of SARS-CoV-2 by FDA under an Emergency Use Authorization (EUA).  This EUA will remain in effect (meaning this test can be used) for the duration of the COVID-19 declaration under Section 564(b)(1) of the Act, 21 U.S.C. section 360bbb-3(b)(1), unless the authorization is terminated or revoked  sooner. Performed at [facility], [address] [place], [place] [address]     Coagulation Studies: No results for input(s): LABPROT, INR in the last 72 hours.  Urinalysis: Recent Labs    11/28/18 1719  COLORURINE STRAW*  LABSPEC 1.009  PHURINE 5.0  GLUCOSEU 50*  HGBUR NEGATIVE  BILIRUBINUR NEGATIVE  KETONESUR NEGATIVE  PROTEINUR NEGATIVE  NITRITE NEGATIVE  LEUKOCYTESUR NEGATIVE      Imaging: Dg Chest 2 View  Result Date: 11/28/2018 CLINICAL DATA:  Weakness x1 week, worse today. Hx of CHF, COPD, diabetes, HTN. Former smoker. EXAM: CHEST - 2 VIEW COMPARISON:  It is 09/13/2017 FINDINGS: LEFT-sided transvenous pacemaker leads overlie the RIGHT atrium, RIGHT ventricle, and coronary sinus. The heart is enlarged and stable in configuration. No pulmonary edema. No focal consolidations. Below fragments overlying the LEFT humeral head. There are extensive degenerative changes in the midthoracic spine. IMPRESSION: Stable cardiomegaly. No evidence for acute pulmonary abnormality. Electronically Signed   By: Nolon Nations M.D.   On: 11/28/2018 17:42   US Renal  Result Date: 11/29/2018 CLINICAL DATA:  Acute kidney injury EXAM: RENAL / URINARY TRACT ULTRASOUND COMPLETE COMPARISON:  None. FINDINGS: Right Kidney: Renal measurements: 9.6 x 6.0 x 5.0 cm = volume: 152 mL. 2 cm exophytic cyst off the midpole. No hydronephrosis. Normal echotexture. Left Kidney: Renal measurements: 10.6 x 5.2 x 5.4 cm = volume: 154 mL. Echogenicity within normal limits. No mass or hydronephrosis visualized. Bladder: Appears normal for degree of bladder distention. Other: None IMPRESSION: No acute findings.  No hydronephrosis. Electronically Signed   By: Rolm Baptise M.D.   On: 11/29/2018 08:54      Assessment & Plan: Mr. Shakeim Batt is a 78 y.o. black male with diabetes mellitus type II insulin dependent, congestive heart failure, hypertension, coronary artery disease status post CABG, COPD,  atrial fibrillation, AICD placement and gout who is admitted to [facility] on 11/28/2018 for Dehydration [id number] Chronic systolic congestive heart failure ([place]) [I50.22] AKI (acute kidney injury) ([place]) [N17.9]  1. Acute renal failure on chronic kidney disease stage IV: baseline creatinine of 2.33, GFR of 29 on 8/19. Patient reports most recent GFR at [place] is 25.  Chronic kidney disease secondary to diabetic nephropathy Acute kidney injury secondary to overdiuresis from metolazone - Holding diuretics - Gentle IV fluids  2. Hypertension and chronic congestive heart failure Home diuretic regimen of torsemide, spironolactone and metolazone.  Holding all diuretic agents.  Recommend not restarting metolazone - Continue metoprolol  3. Hypokalemia: secondary to diuretics - IV replacement  4. Diabetes mellitus type II with chronic  kidney disease: insulin dependent. Urinalysis with glucosuria.      LOS: 1 Henrique Parekh 10/23/20202:12 PM

## 2018-11-30 LAB — GLUCOSE, CAPILLARY
Glucose-Capillary: 176 mg/dL — ABNORMAL HIGH (ref 70–99)
Glucose-Capillary: 227 mg/dL — ABNORMAL HIGH (ref 70–99)
Glucose-Capillary: 200 mg/dL — ABNORMAL HIGH (ref 70–99)

## 2018-11-30 LAB — POTASSIUM: Potassium: 4.1 mmol/L (ref 3.5–5.1)

## 2018-11-30 LAB — BASIC METABOLIC PANEL
Anion gap: 13 (ref 5–15)
BUN: 82 mg/dL — ABNORMAL HIGH (ref 8–23)
CO2: 34 mmol/L — ABNORMAL HIGH (ref 22–32)
Calcium: 9.2 mg/dL (ref 8.9–10.3)
Chloride: 89 mmol/L — ABNORMAL LOW (ref 98–111)
Creatinine, Ser: 3.76 mg/dL — ABNORMAL HIGH (ref 0.61–1.24)
GFR calc Af Amer: 17 mL/min — ABNORMAL LOW (ref >60)
GFR calc non Af Amer: 14 mL/min — ABNORMAL LOW (ref >60)
Glucose, Bld: 177 mg/dL — ABNORMAL HIGH (ref 70–99)
Potassium: 3.2 mmol/L — ABNORMAL LOW (ref 3.5–5.1)
Sodium: 136 mmol/L (ref 135–145)

## 2018-11-30 LAB — MAGNESIUM: Magnesium: 2.8 mg/dL — ABNORMAL HIGH (ref 1.7–2.4)

## 2018-11-30 LAB — PHOSPHORUS: Phosphorus: 3.6 mg/dL (ref 2.5–4.6)

## 2018-11-30 MED ORDER — POTASSIUM CHLORIDE CRYS ER 20 MEQ PO TBCR
20.0000 meq | EXTENDED_RELEASE_TABLET | ORAL | Status: AC
  Administered 2018-11-30 (×2): 20 meq via ORAL
  Filled 2018-11-30 (×2): qty 1

## 2018-11-30 NOTE — Consult Note (Signed)
PHARMACY CONSULT NOTE - FOLLOW UP  Pharmacy Consult for Electrolyte Monitoring and Replacement   Recent Labs: Potassium (mmol/L)  Date Value  11/30/2018 4.1  05/19/2012 5.3 (H)   Magnesium (mg/dL)  Date Value  11/30/2018 2.8 (H)   Calcium (mg/dL)  Date Value  11/30/2018 9.2   Calcium, Total (mg/dL)  Date Value  05/19/2012 8.3 (L)   Albumin (g/dL)  Date Value  11/29/2018 3.8   Phosphorus (mg/dL)  Date Value  11/30/2018 3.6   Sodium (mmol/L)  Date Value  11/30/2018 136  05/19/2012 139     Assessment: Pharmacy has been consulted to monitor and replace Electrolytes in 78yo patient suffering from AKI.  K @1616  4.1  Goal of Therapy:  Electrolytes WNL  Plan:  No additional replacement needed at this time.    Will order repeat labs including Magnesium and Potassium tomorrow with AM labs.  Rowland Lathe ,PharmD Clinical Pharmacist 11/30/2018 4:46 PM

## 2018-11-30 NOTE — Progress Notes (Signed)
[place] at [facility]                                                                                                                                                                                  Patient Demographics   Trevor Jennings, is a 78 y.o. male, DOB - November 01, 1940, [id number]  Admit date - 11/28/2018   Admitting Physician Nicholes Mango, MD  Outpatient Primary MD for the patient is [facility] [place]   LOS - 2  Subjective: Patient complains of weakness     Review of Systems:   CONSTITUTIONAL: No documented fever.  Positive fatigue, positive weakness. No weight gain, no weight loss.  EYES: No blurry or double vision.  ENT: No tinnitus. No postnasal drip. No redness of the oropharynx.  RESPIRATORY: No cough, no wheeze, no hemoptysis. No dyspnea.  CARDIOVASCULAR: No chest pain. No orthopnea. No palpitations. No syncope.  GASTROINTESTINAL: No nausea, no vomiting or diarrhea. No abdominal pain. No melena or hematochezia.  GENITOURINARY: No dysuria or hematuria.  ENDOCRINE: No polyuria or nocturia. No heat or cold intolerance.  HEMATOLOGY: No anemia. No bruising. No bleeding.  INTEGUMENTARY: No rashes. No lesions.  MUSCULOSKELETAL: No arthritis. No swelling. No gout.  NEUROLOGIC: No numbness, tingling, or ataxia. No seizure-type activity.  PSYCHIATRIC: No anxiety. No insomnia. No ADD.    Vitals:   Vitals:   11/29/18 1518 11/29/18 1943 11/29/18 2206 11/30/18 0514  BP: 129/61 (!) 117/51 (!) 120/58 (!) 118/47  Pulse: 61 (!) 59 63 (!) 59  Resp: 18 18 16 18   Temp: 97.8 F (36.6 C) (!) 97.4 F (36.3 C) 98 F (36.7 C) 98.3 F (36.8 C)  TempSrc:  Oral Oral Oral  SpO2: 100% 100% 97% 100%  Weight:      Height:        Wt Readings from Last 3 Encounters:  11/28/18 130.5 kg  12/10/17 (!) 140.8 kg  09/13/17 97.1 kg     Intake/Output Summary (Last 24 hours) at 11/30/2018 1221 Last data filed at 11/30/2018 1000 Gross per 24 hour   Intake 951.2 ml  Output 1875 ml  Net -923.8 ml    Physical Exam:   GENERAL: Pleasant-appearing in no apparent distress.  HEAD, EYES, EARS, NOSE AND THROAT: Atraumatic, normocephalic. Extraocular muscles are intact. Pupils equal and reactive to light. Sclerae anicteric. No conjunctival injection. No oro-pharyngeal erythema.  NECK: Supple. There is no jugular venous distention. No bruits, no lymphadenopathy, no thyromegaly.  HEART: Regular rate and rhythm,. No murmurs, no rubs, no clicks.  LUNGS: Clear to auscultation bilaterally. No rales or rhonchi. No wheezes.  ABDOMEN: Soft, flat, nontender, nondistended. Has good bowel sounds.  No hepatosplenomegaly appreciated.  EXTREMITIES: No evidence of any cyanosis, clubbing, or peripheral edema.  +2 pedal and radial pulses bilaterally.  NEUROLOGIC: The patient is alert, awake, and oriented x3 with no focal motor or sensory deficits appreciated bilaterally.  SKIN: Moist and warm with no rashes appreciated.  Psych: Not anxious, depressed LN: No inguinal LN enlargement    Antibiotics   Anti-infectives (From admission, onward)   None      Medications   Scheduled Meds: . amiodarone  400 mg Oral Daily  . aspirin EC  81 mg Oral Daily  . atorvastatin  80 mg Oral q1800  . colchicine  0.6 mg Oral Daily  . docusate sodium  100 mg Oral BID  . fluticasone  1 spray Each Nare Daily  . gabapentin  100 mg Oral TID  . heparin  5,000 Units Subcutaneous Q8H  . influenza vaccine adjuvanted  0.5 mL Intramuscular Tomorrow-1000  . insulin aspart  0-5 Units Subcutaneous QHS  . insulin aspart  0-9 Units Subcutaneous TID WC  . insulin glargine  40 Units Subcutaneous Daily  . metoprolol tartrate  25 mg Oral BID  . pantoprazole  40 mg Oral Daily  . polyvinyl alcohol  1 drop Both Eyes QID  . sertraline  50 mg Oral Daily   Continuous Infusions: PRN Meds:.acetaminophen **OR** acetaminophen, ondansetron **OR** ondansetron (ZOFRAN) IV   Data Review:    Micro Results Recent Results (from the past 240 hour(s))  SARS Coronavirus 2 by RT PCR (hospital order, performed in [place] hospital lab) Nasopharyngeal Nasopharyngeal Swab     Status: None   Collection Time: 11/28/18  6:23 PM   Specimen: Nasopharyngeal Swab  Result Value Ref Range Status   SARS Coronavirus 2 NEGATIVE NEGATIVE Final    Comment: (NOTE) If result is NEGATIVE SARS-CoV-2 target nucleic acids are NOT DETECTED. The SARS-CoV-2 RNA is generally detectable in upper and lower  respiratory specimens during the acute phase of infection. The lowest  concentration of SARS-CoV-2 viral copies this assay can detect is 250  copies / mL. A negative result does not preclude SARS-CoV-2 infection  and should not be used as the sole basis for treatment or other  patient management decisions.  A negative result may occur with  improper specimen collection / handling, submission of specimen other  than nasopharyngeal swab, presence of viral mutation(s) within the  areas targeted by this assay, and inadequate number of viral copies  (<250 copies / mL). A negative result must be combined with clinical  observations, patient history, and epidemiological information. If result is POSITIVE SARS-CoV-2 target nucleic acids are DETECTED. The SARS-CoV-2 RNA is generally detectable in upper and lower  respiratory specimens dur ing the acute phase of infection.  Positive  results are indicative of active infection with SARS-CoV-2.  Clinical  correlation with patient history and other diagnostic information is  necessary to determine patient infection status.  Positive results do  not rule out bacterial infection or co-infection with other viruses. If result is PRESUMPTIVE POSTIVE SARS-CoV-2 nucleic acids MAY BE PRESENT.   A presumptive positive result was obtained on the submitted specimen  and confirmed on repeat testing.  While 2019 novel coronavirus  (SARS-CoV-2) nucleic acids may be  present in the submitted sample  additional confirmatory testing may be necessary for epidemiological  and / or clinical management purposes  to differentiate between  SARS-CoV-2 and other Sarbecovirus currently known to infect humans.  If clinically indicated additional testing with an alternate test  methodology [phone]) is advised. The SARS-CoV-2 RNA is generally  detectable in upper and lower respiratory sp ecimens during the acute  phase of infection. The expected result is Negative. Fact Sheet for Patients:  StrictlyIdeas.no Fact Sheet for Healthcare Providers: BankingDealers.co.za This test is not yet approved or cleared by the [place] FDA and has been authorized for detection and/or diagnosis of SARS-CoV-2 by FDA under an Emergency Use Authorization (EUA).  This EUA will remain in effect (meaning this test can be used) for the duration of the COVID-19 declaration under Section 564(b)(1) of the Act, 21 U.S.C. section 360bbb-3(b)(1), unless the authorization is terminated or revoked sooner. Performed at [facility], [address], [place], [place] [address]     Radiology Reports Dg Chest 2 View  Result Date: 11/28/2018 CLINICAL DATA:  Weakness x1 week, worse today. Hx of CHF, COPD, diabetes, HTN. Former smoker. EXAM: CHEST - 2 VIEW COMPARISON:  It is 09/13/2017 FINDINGS: LEFT-sided transvenous pacemaker leads overlie the RIGHT atrium, RIGHT ventricle, and coronary sinus. The heart is enlarged and stable in configuration. No pulmonary edema. No focal consolidations. Below fragments overlying the LEFT humeral head. There are extensive degenerative changes in the midthoracic spine. IMPRESSION: Stable cardiomegaly. No evidence for acute pulmonary abnormality. Electronically Signed   By: Nolon Nations M.D.   On: 11/28/2018 17:42   US Renal  Result Date: 11/29/2018 CLINICAL DATA:  Acute kidney injury EXAM: RENAL /  URINARY TRACT ULTRASOUND COMPLETE COMPARISON:  None. FINDINGS: Right Kidney: Renal measurements: 9.6 x 6.0 x 5.0 cm = volume: 152 mL. 2 cm exophytic cyst off the midpole. No hydronephrosis. Normal echotexture. Left Kidney: Renal measurements: 10.6 x 5.2 x 5.4 cm = volume: 154 mL. Echogenicity within normal limits. No mass or hydronephrosis visualized. Bladder: Appears normal for degree of bladder distention. Other: None IMPRESSION: No acute findings.  No hydronephrosis. Electronically Signed   By: Rolm Baptise M.D.   On: 11/29/2018 08:54     CBC Recent Labs  Lab 11/28/18 1719 11/29/18 0351  WBC 8.7 7.6  HGB 10.5* 10.9*  HCT 35.7* 39.1  PLT 235 217  MCV 78.8* 82.3  MCH 23.2* 22.9*  MCHC 29.4* 27.9*  RDW 20.7* 21.2*  LYMPHSABS 1.9  --   MONOABS 0.9  --   EOSABS 0.1  --   BASOSABS 0.1  --     Chemistries  Recent Labs  Lab 11/28/18 1719 11/29/18 0351 11/29/18 1752 11/30/18 0509  NA 135 136  --  136  K 3.2* 2.8* 3.4* 3.2*  CL 83* 86*  --  89*  CO2 32 33*  --  34*  GLUCOSE 135* 146*  --  177*  BUN 87* 83*  --  82*  CREATININE 4.63* 4.11*  --  3.76*  CALCIUM 9.6 9.5  --  9.2  MG  --   --   --  2.8*  AST 23 21  --   --   ALT 27 25  --   --   ALKPHOS 99 92  --   --   BILITOT 0.7 0.8  --   --    ------------------------------------------------------------------------------------------------------------------ estimated creatinine clearance is 22 mL/min (A) (by C-G formula based on SCr of 3.76 mg/dL (H)). ------------------------------------------------------------------------------------------------------------------ Recent Labs    11/29/18 0955  HGBA1C 6.4*   ------------------------------------------------------------------------------------------------------------------ No results for input(s): CHOL, HDL, LDLCALC, TRIG, CHOLHDL, LDLDIRECT in the last 72  hours. ------------------------------------------------------------------------------------------------------------------ No results for input(s): TSH, T4TOTAL, T3FREE, THYROIDAB in the last 72 hours.  Invalid input(s): FREET3 ------------------------------------------------------------------------------------------------------------------ No results for input(s): VITAMINB12, FOLATE, FERRITIN, TIBC, IRON, RETICCTPCT in the last 72 hours.  Coagulation profile No results for input(s): INR, PROTIME in the last 168 hours.  No results for input(s): DDIMER in the last 72 hours.  Cardiac Enzymes No results for input(s): CKMB, TROPONINI, MYOGLOBIN in the last 168 hours.  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-dyspnea related to overdiuresis due to recent hospitalization for congestive heart failure Patient renal function is improved Renal ultrasound showed no hydronephrosis Nephrology following  #Syncope- This is related to dehydration likely orthostatic hypotension which were not checked in the emergency room I will check orthostatic hypotension Continue to monitor patient  #Obstructive sleep apnea CPAP nightly   #Diabetes mellitus sliding scale insulin and carb modified diet  Continue his home insulin    #Essential hypertension continue home medication Coreg and hold off on the diuretics  #History of CHF type unknown diuresis on hold patient currently clinically euvolemic  #Hyperlipidemia will continue his current Lipitor      Code Status Orders  (From admission, onward)         Start     Ordered   11/28/18 2219  Full code  Continuous     11/28/18 2218        Code Status History    This patient has a current code status but no historical code status.   Advance Care Planning  Activity    Advance Directive Documentation     Most Recent Value  Type of Advance Directive  Healthcare Power of Attorney, Living will  Pre-existing out of facility DNR order (yellow form or pink MOST form)  -  "MOST" Form in Place?  -           Consults  nephrology  DVT Prophylaxis continue heparin  Lab Results  Component Value Date   PLT 217 11/29/2018     Time Spent in minutes   35 minutes  Greater than 50% of time spent in care coordination and counseling patient regarding the condition and plan of care.   Dustin Flock M.D on 11/30/2018 at 12:21 PM  Between 7am to 6pm - Pager - [phone]  After 6pm go to [email or web address] - Proofreader  [employer]   Office  [phone]

## 2018-11-30 NOTE — Progress Notes (Signed)
Trevor Jennings  MRN: [id number]  DOB/AGE: 1940/04/11 78 y.o.  Primary Care Physician:[facility] [place] date: 11/28/2018  Chief Complaint:  Chief Complaint  Patient presents with  . Loss of Consciousness  . Weakness    S-Pt presented on  11/28/2018 with  Chief Complaint  Patient presents with  . Loss of Consciousness  . Weakness  .    Pt today feels better.  Patient offers no specific complaints.  Patient spouse was present in the room and she had questions about his kidney function.  I answered patient and his spouse questions to the best of my ability  Meds . amiodarone  400 mg Oral Daily  . aspirin EC  81 mg Oral Daily  . atorvastatin  80 mg Oral q1800  . colchicine  0.6 mg Oral Daily  . docusate sodium  100 mg Oral BID  . fluticasone  1 spray Each Nare Daily  . gabapentin  100 mg Oral TID  . heparin  5,000 Units Subcutaneous Q8H  . influenza vaccine adjuvanted  0.5 mL Intramuscular Tomorrow-1000  . insulin aspart  0-5 Units Subcutaneous QHS  . insulin aspart  0-9 Units Subcutaneous TID WC  . insulin glargine  40 Units Subcutaneous Daily  . metoprolol tartrate  25 mg Oral BID  . pantoprazole  40 mg Oral Daily  . polyvinyl alcohol  1 drop Both Eyes QID  . sertraline  50 mg Oral Daily         [id number] from the symptoms mentioned above,there are no other symptoms referable to all systems reviewed.  Physical Exam: Vital signs in last 24 hours: Temp:  [97.4 F (36.3 C)-98.3 F (36.8 C)] 98.3 F (36.8 C) (10/24 0514) Pulse Rate:  [59-63] 59 (10/24 0514) Resp:  [16-18] 18 (10/24 0514) BP: (117-129)/(47-61) 118/47 (10/24 0514) SpO2:  [97 %-100 %] 100 % (10/24 0514) Weight change:  Last BM Date: 11/29/18  Intake/Output from previous day: 10/23 0701 - 10/24 0700 In: 786.2 [P.O.:240; I.V.:75; IV Piggyback:471.2] Out: 1525 [Urine:1525] No intake/output data recorded.   Physical Exam: General- pt is awake,alert, oriented to time place and  person Resp- No acute REsp distress, CTA B/L NO Rhonchi CVS- S1S2 regular in rate and rhythm GIT- BS+, soft, NT, ND EXT- NO LE Edema, no cyanosis   Lab Results: CBC Recent Labs    11/28/18 1719 11/29/18 0351  WBC 8.7 7.6  HGB 10.5* 10.9*  HCT 35.7* 39.1  PLT 235 217    BMET Recent Labs    11/29/18 0351 11/29/18 1752 11/30/18 0509  NA 136  --  136  K 2.8* 3.4* 3.2*  CL 86*  --  89*  CO2 33*  --  34*  GLUCOSE 146*  --  177*  BUN 83*  --  82*  CREATININE 4.11*  --  3.76*  CALCIUM 9.5  --  9.2    Creat trend 2020  4.6=> 3.76 2019   2.3   MICRO Recent Results (from the past 240 hour(s))  SARS Coronavirus 2 by RT PCR (hospital order, performed in [facility] hospital lab) Nasopharyngeal Nasopharyngeal Swab     Status: None   Collection Time: 11/28/18  6:23 PM   Specimen: Nasopharyngeal Swab  Result Value Ref Range Status   SARS Coronavirus 2 NEGATIVE NEGATIVE Final    Comment: (NOTE) If result is NEGATIVE SARS-CoV-2 target nucleic acids are NOT DETECTED. The SARS-CoV-2 RNA is generally detectable in upper and lower  respiratory specimens during the acute  phase of infection. The lowest  concentration of SARS-CoV-2 viral copies this assay can detect is 250  copies / mL. A negative result does not preclude SARS-CoV-2 infection  and should not be used as the sole basis for treatment or other  patient management decisions.  A negative result may occur with  improper specimen collection / handling, submission of specimen other  than nasopharyngeal swab, presence of viral mutation(s) within the  areas targeted by this assay, and inadequate number of viral copies  (<250 copies / mL). A negative result must be combined with clinical  observations, patient history, and epidemiological information. If result is POSITIVE SARS-CoV-2 target nucleic acids are DETECTED. The SARS-CoV-2 RNA is generally detectable in upper and lower  respiratory specimens dur ing the acute  phase of infection.  Positive  results are indicative of active infection with SARS-CoV-2.  Clinical  correlation with patient history and other diagnostic information is  necessary to determine patient infection status.  Positive results do  not rule out bacterial infection or co-infection with other viruses. If result is PRESUMPTIVE POSTIVE SARS-CoV-2 nucleic acids MAY BE PRESENT.   A presumptive positive result was obtained on the submitted specimen  and confirmed on repeat testing.  While 2019 novel coronavirus  (SARS-CoV-2) nucleic acids may be present in the submitted sample  additional confirmatory testing may be necessary for epidemiological  and / or clinical management purposes  to differentiate between  SARS-CoV-2 and other Sarbecovirus currently known to infect humans.  If clinically indicated additional testing with an alternate test  methodology [phone] is advised. The SARS-CoV-2 RNA is generally  detectable in upper and lower respiratory sp ecimens during the acute  phase of infection. The expected result is Negative. Fact Sheet for Patients:  StrictlyIdeas.no Fact Sheet for Healthcare Providers: BankingDealers.co.za This test is not yet approved or cleared by the [place] FDA and has been authorized for detection and/or diagnosis of SARS-CoV-2 by FDA under an Emergency Use Authorization (EUA).  This EUA will remain in effect (meaning this test can be used) for the duration of the COVID-19 declaration under Section 564(b)(1) of the Act, 21 U.S.C. section 360bbb-3(b)(1), unless the authorization is terminated or revoked sooner. Performed at [facility], [address], [place],  [address]       Lab Results  Component Value Date   CALCIUM 9.2 11/30/2018   PHOS 3.6 11/30/2018               Impression:  Mr. Jafeth Mcgary is a 78 y.o. African-American male with diabetes mellitus  type II insulin dependent, congestive heart failure, hypertension, coronary artery disease status post CABG, COPD, atrial fibrillation, AICD placement and gout who is admitted to [facility] on 11/28/2018 for  hypovolemia, Chronic systolic congestive heart failure and  AKI (acute kidney injury) .    1)Renal  AKI secondary to hypovolemia                Hypovolemia secondary to overdiuresis                AKI on CKD                CKD stage IV.                CKD secondary to diabetes mellitus                Progression of CKD marked with acute kidney injury  AKI now better   2)HTN  Medication-  On Beta blockers    3)Anemia of chronic disease  HGb at goal (9--11) No need for Epogen  4)CKD Mineral-Bone Disorder  Phosphorus at goal.   5) hypokalemia Secondary to overdiuresis Potassium responding to replacement Being replete  6) CHF Well compensated  7)Acid base Co2 at goal  8) diabetes mellitus Being followed by the primary team   Plan:   Agree with current treatment plan Agree with holding diuretics We will request not to start metolazone We will continue to follow Chem-7    Zael Shuman s [facility] 11/30/2018, 9:51 AM

## 2018-12-01 LAB — RENAL FUNCTION PANEL
Albumin: 3.4 g/dL — ABNORMAL LOW (ref 3.5–5.0)
Anion gap: 15 (ref 5–15)
BUN: 77 mg/dL — ABNORMAL HIGH (ref 8–23)
CO2: 31 mmol/L (ref 22–32)
Calcium: 9.4 mg/dL (ref 8.9–10.3)
Chloride: 94 mmol/L — ABNORMAL LOW (ref 98–111)
Creatinine, Ser: 3.3 mg/dL — ABNORMAL HIGH (ref 0.61–1.24)
GFR calc Af Amer: 20 mL/min — ABNORMAL LOW (ref >60)
GFR calc non Af Amer: 17 mL/min — ABNORMAL LOW (ref >60)
Glucose, Bld: 193 mg/dL — ABNORMAL HIGH (ref 70–99)
Phosphorus: 3.3 mg/dL (ref 2.5–4.6)
Potassium: 3.7 mmol/L (ref 3.5–5.1)
Sodium: 140 mmol/L (ref 135–145)

## 2018-12-01 LAB — GLUCOSE, CAPILLARY
Glucose-Capillary: 163 mg/dL — ABNORMAL HIGH (ref 70–99)
Glucose-Capillary: 286 mg/dL — ABNORMAL HIGH (ref 70–99)
Glucose-Capillary: 154 mg/dL — ABNORMAL HIGH (ref 70–99)
Glucose-Capillary: 224 mg/dL — ABNORMAL HIGH (ref 70–99)

## 2018-12-01 LAB — MAGNESIUM: Magnesium: 2.9 mg/dL — ABNORMAL HIGH (ref 1.7–2.4)

## 2018-12-01 NOTE — Progress Notes (Signed)
[place] [facility]                                                                                                                                                                                  Patient Demographics   Trevor Jennings, is a 78 y.o. male, DOB - [birth year], [id number]  Admit date - 11/28/2018   Admitting Physician Nicholes Mango, MD  Outpatient Primary MD for the patient is [facility]   LOS - 3  Subjective: Patient feeling stronger still very weak     Review of Systems:   CONSTITUTIONAL: No documented fever.  Positive fatigue, positive weakness. No weight gain, no weight loss.  EYES: No blurry or double vision.  ENT: No tinnitus. No postnasal drip. No redness of the oropharynx.  RESPIRATORY: No cough, no wheeze, no hemoptysis. No dyspnea.  CARDIOVASCULAR: No chest pain. No orthopnea. No palpitations. No syncope.  GASTROINTESTINAL: No nausea, no vomiting or diarrhea. No abdominal pain. No melena or hematochezia.  GENITOURINARY: No dysuria or hematuria.  ENDOCRINE: No polyuria or nocturia. No heat or cold intolerance.  HEMATOLOGY: No anemia. No bruising. No bleeding.  INTEGUMENTARY: No rashes. No lesions.  MUSCULOSKELETAL: No arthritis. No swelling. No gout.  NEUROLOGIC: No numbness, tingling, or ataxia. No seizure-type activity.  PSYCHIATRIC: No anxiety. No insomnia. No ADD.    Vitals:   Vitals:   11/30/18 2027 11/30/18 2224 12/01/18 0608 12/01/18 0803  BP: (!) 117/54 133/62 (!) 120/50 (!) 120/57  Pulse: (!) 59 60 61 60  Resp: 16  16 18   Temp: 98.2 F (36.8 C)  98.2 F (36.8 C) 98.1 F (36.7 C)  TempSrc: Oral  Oral Oral  SpO2: 98%  99% 98%  Weight:      Height:        Wt Readings from Last 3 Encounters:  11/28/18 130.5 kg  12/10/17 (!) 140.8 kg  09/13/17 97.1 kg     Intake/Output Summary (Last 24 hours) at 12/01/2018 1032 Last data filed at 12/01/2018 0900 Gross per 24 hour  Intake 240 ml   Output 300 ml  Net -60 ml    Physical Exam:   GENERAL: Pleasant-appearing in no apparent distress.  HEAD, EYES, EARS, NOSE AND THROAT: Atraumatic, normocephalic. Extraocular muscles are intact. Pupils equal and reactive to light. Sclerae anicteric. No conjunctival injection. No oro-pharyngeal erythema.  NECK: Supple. There is no jugular venous distention. No bruits, no lymphadenopathy, no thyromegaly.  HEART: Regular rate and rhythm,. No murmurs, no rubs, no clicks.  LUNGS: Clear to auscultation bilaterally. No rales or rhonchi. No wheezes.  ABDOMEN: Soft, flat, nontender, nondistended. Has good bowel sounds. No hepatosplenomegaly appreciated.  EXTREMITIES: No evidence of any cyanosis, clubbing, or peripheral edema.  +2 pedal and radial pulses bilaterally.  NEUROLOGIC: The patient is alert, awake, and oriented x3 with no focal motor or sensory deficits appreciated bilaterally.  SKIN: Moist and warm with no rashes appreciated.  Psych: Not anxious, depressed LN: No inguinal LN enlargement    Antibiotics   Anti-infectives (From admission, onward)   None      Medications   Scheduled Meds: . amiodarone  400 mg Oral Daily  . aspirin EC  81 mg Oral Daily  . atorvastatin  80 mg Oral q1800  . colchicine  0.6 mg Oral Daily  . docusate sodium  100 mg Oral BID  . fluticasone  1 spray Each Nare Daily  . gabapentin  100 mg Oral TID  . heparin  5,000 Units Subcutaneous Q8H  . influenza vaccine adjuvanted  0.5 mL Intramuscular Tomorrow-1000  . insulin aspart  0-5 Units Subcutaneous QHS  . insulin aspart  0-9 Units Subcutaneous TID WC  . insulin glargine  40 Units Subcutaneous Daily  . metoprolol tartrate  25 mg Oral BID  . pantoprazole  40 mg Oral Daily  . polyvinyl alcohol  1 drop Both Eyes QID  . sertraline  50 mg Oral Daily   Continuous Infusions: PRN Meds:.acetaminophen **OR** acetaminophen, ondansetron **OR** ondansetron (ZOFRAN) IV   Data Review:   Micro Results Recent  Results (from the past 240 hour(s))  SARS Coronavirus 2 by RT PCR (hospital order, performed in [facility] hospital lab) Nasopharyngeal Nasopharyngeal Swab     Status: None   Collection Time: 11/28/18  6:23 PM   Specimen: Nasopharyngeal Swab  Result Value Ref Range Status   SARS Coronavirus 2 NEGATIVE NEGATIVE Final    Comment: (NOTE) If result is NEGATIVE SARS-CoV-2 target nucleic acids are NOT DETECTED. The SARS-CoV-2 RNA is generally detectable in upper and lower  respiratory specimens during the acute phase of infection. The lowest  concentration of SARS-CoV-2 viral copies this assay can detect is 250  copies / mL. A negative result does not preclude SARS-CoV-2 infection  and should not be used as the sole basis for treatment or other  patient management decisions.  A negative result may occur with  improper specimen collection / handling, submission of specimen other  than nasopharyngeal swab, presence of viral mutation(s) within the  areas targeted by this assay, and inadequate number of viral copies  (<250 copies / mL). A negative result must be combined with clinical  observations, patient history, and epidemiological information. If result is POSITIVE SARS-CoV-2 target nucleic acids are DETECTED. The SARS-CoV-2 RNA is generally detectable in upper and lower  respiratory specimens dur ing the acute phase of infection.  Positive  results are indicative of active infection with SARS-CoV-2.  Clinical  correlation with patient history and other diagnostic information is  necessary to determine patient infection status.  Positive results do  not rule out bacterial infection or co-infection with other viruses. If result is PRESUMPTIVE POSTIVE SARS-CoV-2 nucleic acids MAY BE PRESENT.   A presumptive positive result was obtained on the submitted specimen  and confirmed on repeat testing.  While 2019 novel coronavirus  (SARS-CoV-2) nucleic acids may be present in the submitted sample   additional confirmatory testing may be necessary for epidemiological  and / or clinical management purposes  to differentiate between  SARS-CoV-2 and other Sarbecovirus currently known to infect humans.  If clinically indicated additional testing with an alternate test  methodology [phone]) is  advised. The SARS-CoV-2 RNA is generally  detectable in upper and lower respiratory sp ecimens during the acute  phase of infection. The expected result is Negative. Fact Sheet for Patients:  StrictlyIdeas.no Fact Sheet for Healthcare Providers: BankingDealers.co.za This test is not yet approved or cleared by the [place] FDA and has been authorized for detection and/or diagnosis of SARS-CoV-2 by FDA under an Emergency Use Authorization (EUA).  This EUA will remain in effect (meaning this test can be used) for the duration of the COVID-19 declaration under Section 564(b)(1) of the Act, 21 U.S.C. section 360bbb-3(b)(1), unless the authorization is terminated or revoked sooner. Performed at [facility], [address], [place], [place] [address]     Radiology Reports Dg Chest 2 View  Result Date: 11/28/2018 CLINICAL DATA:  Weakness x1 week, worse today. Hx of CHF, COPD, diabetes, HTN. Former smoker. EXAM: CHEST - 2 VIEW COMPARISON:  It is 09/13/2017 FINDINGS: LEFT-sided transvenous pacemaker leads overlie the RIGHT atrium, RIGHT ventricle, and coronary sinus. The heart is enlarged and stable in configuration. No pulmonary edema. No focal consolidations. Below fragments overlying the LEFT humeral head. There are extensive degenerative changes in the midthoracic spine. IMPRESSION: Stable cardiomegaly. No evidence for acute pulmonary abnormality. Electronically Signed   By: Nolon Nations M.D.   On: 11/28/2018 17:42   US Renal  Result Date: 11/29/2018 CLINICAL DATA:  Acute kidney injury EXAM: RENAL / URINARY TRACT ULTRASOUND COMPLETE  COMPARISON:  None. FINDINGS: Right Kidney: Renal measurements: 9.6 x 6.0 x 5.0 cm = volume: 152 mL. 2 cm exophytic cyst off the midpole. No hydronephrosis. Normal echotexture. Left Kidney: Renal measurements: 10.6 x 5.2 x 5.4 cm = volume: 154 mL. Echogenicity within normal limits. No mass or hydronephrosis visualized. Bladder: Appears normal for degree of bladder distention. Other: None IMPRESSION: No acute findings.  No hydronephrosis. Electronically Signed   By: Rolm Baptise M.D.   On: 11/29/2018 08:54     CBC Recent Labs  Lab 11/28/18 1719 11/29/18 0351  WBC 8.7 7.6  HGB 10.5* 10.9*  HCT 35.7* 39.1  PLT 235 217  MCV 78.8* 82.3  MCH 23.2* 22.9*  MCHC 29.4* 27.9*  RDW 20.7* 21.2*  LYMPHSABS 1.9  --   MONOABS 0.9  --   EOSABS 0.1  --   BASOSABS 0.1  --     Chemistries  Recent Labs  Lab 11/28/18 1719 11/29/18 0351 11/29/18 1752 11/30/18 0509 11/30/18 1616 12/01/18 0459  NA 135 136  --  136  --  140  K 3.2* 2.8* 3.4* 3.2* 4.1 3.7  CL 83* 86*  --  89*  --  94*  CO2 32 33*  --  34*  --  31  GLUCOSE 135* 146*  --  177*  --  193*  BUN 87* 83*  --  82*  --  77*  CREATININE 4.63* 4.11*  --  3.76*  --  3.30*  CALCIUM 9.6 9.5  --  9.2  --  9.4  MG  --   --   --  2.8*  --  2.9*  AST 23 21  --   --   --   --   ALT 27 25  --   --   --   --   ALKPHOS 99 92  --   --   --   --   BILITOT 0.7 0.8  --   --   --   --    ------------------------------------------------------------------------------------------------------------------ estimated creatinine clearance  is 25.1 mL/min (A) (by C-G formula based on SCr of 3.3 mg/dL (H)). ------------------------------------------------------------------------------------------------------------------ Recent Labs    11/29/18 0955  HGBA1C 6.4*   ------------------------------------------------------------------------------------------------------------------ No results for input(s): CHOL, HDL, LDLCALC, TRIG, CHOLHDL, LDLDIRECT in the last 72  hours. ------------------------------------------------------------------------------------------------------------------ No results for input(s): TSH, T4TOTAL, T3FREE, THYROIDAB in the last 72 hours.  Invalid input(s): FREET3 ------------------------------------------------------------------------------------------------------------------ No results for input(s): VITAMINB12, FOLATE, FERRITIN, TIBC, IRON, RETICCTPCT in the last 72 hours.  Coagulation profile No results for input(s): INR, PROTIME in the last 168 hours.  No results for input(s): DDIMER in the last 72 hours.  Cardiac Enzymes No results for input(s): CKMB, TROPONINI, MYOGLOBIN in the last 168 hours.  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-dyspnea related to overdiuresis due to recent hospitalization for congestive heart failure Patient renal function is slowly improved Renal ultrasound showed no hydronephrosis Nephrology following Avoid nephrotoxins  #Syncope- This is related to dehydration likely orthostatic hypotension which were not checked in the emergency room I  #Generalized weakness PT evaluation is pending  #Obstructive sleep apnea CPAP nightly   #Diabetes mellitus sliding scale insulin and carb modified diet  Continue his home insulin Blood sugars were labile   #Essential hypertension continue home medication Coreg and hold off on the diuretics  #History of CHF type unknown diuresis on hold patient currently clinically euvolemic  #Hyperlipidemia will continue his current Lipitor      Code Status Orders  (From admission, onward)         Start     Ordered   11/28/18 2219  Full code  Continuous     11/28/18 2218        Code Status History    This patient has a current code status but no historical  code status.   Advance Care Planning Activity    Advance Directive Documentation     Most Recent Value  Type of Advance Directive  Healthcare Power of Attorney, Living will  Pre-existing out of facility DNR order (yellow form or pink MOST form)  -  "MOST" Form in Place?  -           Consults  nephrology  DVT Prophylaxis continue heparin  Lab Results  Component Value Date   PLT 217 11/29/2018     Time Spent in minutes   35 minutes  Greater than 50% of time spent in care coordination and counseling patient regarding the condition and plan of care.   Dustin Flock M.D on 12/01/2018 at 10:32 AM  Between 7am to 6pm - Pager - [phone]  After 6pm go to [email or web address] - Proofreader  [employer]   Office  [phone]

## 2018-12-01 NOTE — Evaluation (Signed)
Physical Therapy Evaluation Patient Details Name: Trevor Jennings MRN: [id number] DOB: 1940/02/28 Today's Date: 12/01/2018   History of Present Illness  78 yo male with onset of falls at home was discovered to be dehydrated, with syncopal episodes and weakness.  Has elevated creatinine at admission, had mult admissions over the summer and most recent for CHF, wgt fluctuations.  PMHx:  CHF, COPD, pacemaker, DM, HTN, smoker, CABG, HLD, defibrillator, CKD, mitral valve incompetence  Clinical Impression  Pt was seen for mobility and note his fatigue but stiffness on LLE esp hip.  Talked with pt about the science of balance and need to get his hip moving more to extend and abduct adequately.  Pt is not pleased with his recent experience with rehab, and so agreed to outpatient therapy to work on a longer block of therapy to increase dynamic standing balance.  Follow acutely for the same.    Follow Up Recommendations Outpatient PT;Supervision for mobility/OOB    Equipment Recommendations  Rolling walker with 5" wheels(if pt does not have a walker in good repair)    Recommendations for Other Services       Precautions / Restrictions Precautions Precautions: Fall Precaution Comments: monitor O2 and pulses Restrictions Weight Bearing Restrictions: No      Mobility  Bed Mobility               General bed mobility comments: up in chair when PT arrived  Transfers Overall transfer level: Needs assistance Equipment used: Rolling walker (2 wheeled);1 person hand held assist Transfers: Sit to/from Stand Sit to Stand: Mod assist         General transfer comment: mod to power up but became more efficient with practice  Ambulation/Gait Ambulation/Gait assistance: Min guard Gait Distance (Feet): 125 Feet Assistive device: Rolling walker (2 wheeled);1 person hand held assist Gait Pattern/deviations: Step-through pattern;Wide base of support;Trunk flexed;Decreased stride length;Decreased  weight shift to left Gait velocity: reduced   General Gait Details: awkward to turn to L side to get back to room  Stairs            Wheelchair Mobility    Modified Rankin (Stroke Patients Only)       Balance Overall balance assessment: History of Falls;Needs assistance Sitting-balance support: Feet supported Sitting balance-Leahy Scale: Good     Standing balance support: Bilateral upper extremity supported;During functional activity Standing balance-Leahy Scale: Fair Standing balance comment: less than fair dynamically                             Pertinent Vitals/Pain Pain Assessment: Faces Faces Pain Scale: Hurts even more Pain Location: L knee with initial standing Pain Descriptors / Indicators: Tender Pain Intervention(s): Limited activity within patient's tolerance;Monitored during session;Repositioned    Home Living Family/patient expects to be discharged to:: Private residence Living Arrangements: Spouse/significant other Available Help at Discharge: Family;Available 24 hours/day Type of Home: House Home Access: Stairs to enter Entrance Stairs-Rails: Can reach both Entrance Stairs-Number of Steps: 3 Home Layout: One level Home Equipment: Walker - 2 wheels;Walker - 4 wheels;Cane - single point Additional Comments: has been home with falls related to dehydration from rapid diuresing    Prior Function Level of Independence: Independent with assistive device(s)         Comments: RW for trips in the house     Hand Dominance   Dominant Hand: Right    Extremity/Trunk Assessment   Upper Extremity Assessment Upper Extremity Assessment:  Overall [facility] for tasks assessed    Lower Extremity Assessment Lower Extremity Assessment: (stiffness L hip and knee)    Cervical / Trunk Assessment Cervical / Trunk Assessment: Kyphotic  Communication   Communication: No difficulties  Cognition Arousal/Alertness: Awake/alert Behavior During Therapy:  WFL for tasks assessed/performed Overall Cognitive Status: Within Functional Limits for tasks assessed                                        General Comments General comments (skin integrity, edema, etc.): Pt is up to walk with help, requires assist to power up but is related to stiffness of L hip along with knee pain    Exercises     Assessment/Plan    PT Assessment Patient needs continued PT services  PT Problem List Decreased range of motion;Decreased balance;Decreased activity tolerance;Decreased mobility;Decreased coordination;Decreased safety awareness;Cardiopulmonary status limiting activity;Obesity;Pain       PT Treatment Interventions DME instruction;Gait training;Stair training;Functional mobility training;Therapeutic activities;Therapeutic exercise;Balance training;Neuromuscular re-education;Patient/family education    PT Goals (Current goals can be found in the Care Plan section)  Acute Rehab PT Goals Patient Stated Goal: to walk and not fall PT Goal Formulation: With patient Time For Goal Achievement: 12/15/18 Potential to Achieve Goals: Good    Frequency Min 2X/week   Barriers to discharge Inaccessible home environment home with stairs to enter house    Co-evaluation               AM-PAC PT "6 Clicks" Mobility  Outcome Measure Help needed turning from your back to your side while in a flat bed without using bedrails?: A Little Help needed moving from lying on your back to sitting on the side of a flat bed without using bedrails?: A Little Help needed moving to and from a bed to a chair (including a wheelchair)?: A Little Help needed standing up from a chair using your arms (e.g., wheelchair or bedside chair)?: A Lot Help needed to walk in hospital room?: A Little Help needed climbing 3-5 steps with a railing? : A Lot 6 Click Score: 16    End of Session Equipment Utilized During Treatment: Gait belt Activity Tolerance: Patient  tolerated treatment well;Other (comment)(O2 sats were 99% pre and post gait) Patient left: in chair;with call bell/phone within reach;with chair alarm set Nurse Communication: Mobility status PT Visit Diagnosis: Repeated falls (R29.6);Difficulty in walking, not elsewhere classified (R26.2);Pain Pain - Right/Left: Left Pain - part of body: Hip;Leg;Knee    Time: 1312-1340 PT Time Calculation (min) (ACUTE ONLY): 28 min   Charges:   PT Evaluation $PT Eval Moderate Complexity: 1 Mod PT Treatments $Gait Training: 8-22 mins       Ramond Dial 12/01/2018, 3:31 PM   Mee Hives, PT MS Acute Rehab Dept. Number: [place] [facility] [place]

## 2018-12-01 NOTE — Progress Notes (Signed)
Trevor Jennings  MRN: [id number]  DOB/AGE: 78-Jul-1942 78 y.o.  Primary Care Physician:[facility] date: 11/28/2018  Chief Complaint:  Chief Complaint  Patient presents with  . Loss of Consciousness  . Weakness    S-Pt presented on  11/28/2018 with  Chief Complaint  Patient presents with  . Loss of Consciousness  . Weakness  .    Pt today feels better.  Patient offers no specific complaints.  .   Meds . amiodarone  400 mg Oral Daily  . aspirin EC  81 mg Oral Daily  . atorvastatin  80 mg Oral q1800  . colchicine  0.6 mg Oral Daily  . docusate sodium  100 mg Oral BID  . fluticasone  1 spray Each Nare Daily  . gabapentin  100 mg Oral TID  . heparin  5,000 Units Subcutaneous Q8H  . influenza vaccine adjuvanted  0.5 mL Intramuscular Tomorrow-1000  . insulin aspart  0-5 Units Subcutaneous QHS  . insulin aspart  0-9 Units Subcutaneous TID WC  . insulin glargine  40 Units Subcutaneous Daily  . metoprolol tartrate  25 mg Oral BID  . pantoprazole  40 mg Oral Daily  . polyvinyl alcohol  1 drop Both Eyes QID  . sertraline  50 mg Oral Daily         [id number] from the symptoms mentioned above,there are no other symptoms referable to all systems reviewed.  Physical Exam: Vital signs in last 24 hours: Temp:  [97.7 F (36.5 C)-98.2 F (36.8 C)] 98.1 F (36.7 C) (10/25 0803) Pulse Rate:  [59-62] 60 (10/25 0803) Resp:  [16-18] 16 (10/25 0608) BP: (117-133)/(50-62) 120/57 (10/25 0803) SpO2:  [98 %-99 %] 98 % (10/25 0803) Weight change:  Last BM Date: 12/01/18  Intake/Output from previous day: 10/24 0701 - 10/25 0700 In: 480 [P.O.:480] Out: 650 [Urine:650] No intake/output data recorded.   Physical Exam: General- pt is awake,alert, oriented to time place and person Resp- No acute REsp distress, CTA B/L NO Rhonchi CVS- S1S2 regular in rate and rhythm GIT- BS+, soft, NT, ND EXT- NO LE Edema, no cyanosis   Lab Results: CBC Recent Labs   11/28/18 1719 11/29/18 0351  WBC 8.7 7.6  HGB 10.5* 10.9*  HCT 35.7* 39.1  PLT 235 217    BMET Recent Labs    11/30/18 0509 11/30/18 1616 12/01/18 0459  NA 136  --  140  K 3.2* 4.1 3.7  CL 89*  --  94*  CO2 34*  --  31  GLUCOSE 177*  --  193*  BUN 82*  --  77*  CREATININE 3.76*  --  3.30*  CALCIUM 9.2  --  9.4    Creat trend 2020  4.6=> 3.76=> 3.3 2019   2.3   MICRO Recent Results (from the past 240 hour(s))  SARS Coronavirus 2 by RT PCR (hospital order, performed in [facility] hospital lab) Nasopharyngeal Nasopharyngeal Swab     Status: None   Collection Time: 11/28/18  6:23 PM   Specimen: Nasopharyngeal Swab  Result Value Ref Range Status   SARS Coronavirus 2 NEGATIVE NEGATIVE Final    Comment: (NOTE) If result is NEGATIVE SARS-CoV-2 target nucleic acids are NOT DETECTED. The SARS-CoV-2 RNA is generally detectable in upper and lower  respiratory specimens during the acute phase of infection. The lowest  concentration of SARS-CoV-2 viral copies this assay can detect is 250  copies / mL. A negative result does not preclude SARS-CoV-2 infection  and  should not be used as the sole basis for treatment or other  patient management decisions.  A negative result may occur with  improper specimen collection / handling, submission of specimen other  than nasopharyngeal swab, presence of viral mutation(s) within the  areas targeted by this assay, and inadequate number of viral copies  (<250 copies / mL). A negative result must be combined with clinical  observations, patient history, and epidemiological information. If result is POSITIVE SARS-CoV-2 target nucleic acids are DETECTED. The SARS-CoV-2 RNA is generally detectable in upper and lower  respiratory specimens dur ing the acute phase of infection.  Positive  results are indicative of active infection with SARS-CoV-2.  Clinical  correlation with patient history and other diagnostic information is  necessary to  determine patient infection status.  Positive results do  not rule out bacterial infection or co-infection with other viruses. If result is PRESUMPTIVE POSTIVE SARS-CoV-2 nucleic acids MAY BE PRESENT.   A presumptive positive result was obtained on the submitted specimen  and confirmed on repeat testing.  While 2019 novel coronavirus  (SARS-CoV-2) nucleic acids may be present in the submitted sample  additional confirmatory testing may be necessary for epidemiological  and / or clinical management purposes  to differentiate between  SARS-CoV-2 and other Sarbecovirus currently known to infect humans.  If clinically indicated additional testing with an alternate test  methodology [phone]) is advised. The SARS-CoV-2 RNA is generally  detectable in upper and lower respiratory sp ecimens during the acute  phase of infection. The expected result is Negative. Fact Sheet for Patients:  StrictlyIdeas.no Fact Sheet for Healthcare Providers: BankingDealers.co.za This test is not yet approved or cleared by the [place] FDA and has been authorized for detection and/or diagnosis of SARS-CoV-2 by FDA under an Emergency Use Authorization (EUA).  This EUA will remain in effect (meaning this test can be used) for the duration of the COVID-19 declaration under Section 564(b)(1) of the Act, 21 U.S.C. section 360bbb-3(b)(1), unless the authorization is terminated or revoked sooner. Performed at [facility], [address], [place], [place] [address]       Lab Results  Component Value Date   CALCIUM 9.4 12/01/2018   PHOS 3.3 12/01/2018               Impression:  [name] is a 78 y.o. African-American male with diabetes mellitus type II insulin dependent, congestive heart failure, hypertension, coronary artery disease status post CABG, COPD, atrial fibrillation, AICD placement and gout who is admitted to [facility] on  11/28/2018 for  hypovolemia,Chronic systolic congestive heart failure and  AKI (acute kidney injury) .    1)Renal  AKI secondary to hypovolemia                Hypovolemia secondary to overdiuresis                AKI on CKD                CKD stage IV.                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   5) hypokalemia Secondary to overdiuresis Potassium responding to replacement Now better  6) CHF Well compensated  7)Acid base Co2 at goal  8) diabetes mellitus Being followed by the primary team   Plan:   Agree with current treatment plan We will continue to follow Chem-7    Bleu Minerd s [facility] 12/01/2018, 8:36 AM

## 2018-12-01 NOTE — Consult Note (Signed)
PHARMACY CONSULT NOTE - FOLLOW UP  Pharmacy Consult for Electrolyte Monitoring and Replacement   Recent Labs: Potassium (mmol/L)  Date Value  12/01/2018 3.7  05/19/2012 5.3 (H)   Magnesium (mg/dL)  Date Value  12/01/2018 2.9 (H)   Calcium (mg/dL)  Date Value  12/01/2018 9.4   Calcium, Total (mg/dL)  Date Value  05/19/2012 8.3 (L)   Albumin (g/dL)  Date Value  12/01/2018 3.4 (L)   Phosphorus (mg/dL)  Date Value  12/01/2018 3.3   Sodium (mmol/L)  Date Value  12/01/2018 140  05/19/2012 139     Assessment: Pharmacy has been consulted to monitor and replace Electrolytes in 78yo patient presenting with AKI.  Goal of Therapy:  Electrolytes WNL  Plan:  No additional replacement needed at this time.  Will order repeat labs including Magnesium and Potassium tomorrow with AM labs.  [facility] Clinical Pharmacist 12/01/2018 7:51 AM

## 2018-12-02 LAB — GLUCOSE, CAPILLARY
Glucose-Capillary: 144 mg/dL — ABNORMAL HIGH (ref 70–99)
Glucose-Capillary: 312 mg/dL — ABNORMAL HIGH (ref 70–99)

## 2018-12-02 LAB — RENAL FUNCTION PANEL
Albumin: 3.3 g/dL — ABNORMAL LOW (ref 3.5–5.0)
Anion gap: 14 (ref 5–15)
BUN: 70 mg/dL — ABNORMAL HIGH (ref 8–23)
CO2: 30 mmol/L (ref 22–32)
Calcium: 9 mg/dL (ref 8.9–10.3)
Chloride: 95 mmol/L — ABNORMAL LOW (ref 98–111)
Creatinine, Ser: 3.22 mg/dL — ABNORMAL HIGH (ref 0.61–1.24)
GFR calc Af Amer: 20 mL/min — ABNORMAL LOW (ref >60)
GFR calc non Af Amer: 17 mL/min — ABNORMAL LOW (ref >60)
Glucose, Bld: 134 mg/dL — ABNORMAL HIGH (ref 70–99)
Phosphorus: 3.1 mg/dL (ref 2.5–4.6)
Potassium: 3.7 mmol/L (ref 3.5–5.1)
Sodium: 139 mmol/L (ref 135–145)

## 2018-12-02 LAB — MAGNESIUM: Magnesium: 2.7 mg/dL — ABNORMAL HIGH (ref 1.7–2.4)

## 2018-12-02 NOTE — TOC Initial Note (Signed)
Transition of Care [facility]) - Initial/Assessment Note    Patient Details  Name: Trevor Jennings MRN: [id number] Date of Birth: [birth year]  Transition of Care [facility]) CM/SW Contact:    Shelbie Hutching, RN Phone Number: 12/02/2018, 9:27 AM  Clinical Narrative:                 Patient admitted for acute kidney injury, recently discharged from the [facility].  Patient receives all of his PCP services and gets his medications from the [place].  Patient is from home with his wife.  Patient is open with [facility] home health for PT and OT. Home Health services will be resumed at discharge.  Sharmon Revere with [facility] is aware of admission and is aware that patient will be discharged today.  Patient's wife will come and pick him up.  Patient reports that he has a walker at home. All discharge needs identified and met.    Expected Discharge Plan: [place] Barriers to Discharge: No Barriers Identified   Patient Goals and CMS Choice   CMS Medicare.gov Compare Post Acute Care list provided to:: Patient Choice offered to / list presented to : Patient  Expected Discharge Plan and Services Expected Discharge Plan: [place]   Discharge Planning Services: CM Consult Post Acute Care Choice: Resumption of Svcs/PTA Provider Living arrangements for the past 2 months: Apartment Expected Discharge Date: 12/02/18                         HH Arranged: PT, Nurse's Aide HH Agency: [place] Date HH Agency Contacted: 12/02/18 Time HH Agency Contacted: [id number] Representative spoke with at [place]: Sharmon Revere  Prior Living Arrangements/Services Living arrangements for the past 2 months: Apartment Lives with:: Spouse Patient language and need for interpreter reviewed:: No Do you feel safe going back to the place where you live?: Yes      Need for Family Participation in Patient Care: Yes (Comment)(Acute kidney injury- recently discharged and readmitted) Care  giver support system in place?: Yes (comment)(wife) Current home services: DME(walker) Criminal Activity/Legal Involvement Pertinent to Current Situation/Hospitalization: No - Comment as needed  Activities of Daily Living Home Assistive Devices/Equipment: Walker (specify type), CPAP ADL Screening (condition at time of admission) Patient's cognitive ability adequate to safely complete daily activities?: Yes Is the patient deaf or have difficulty hearing?: No Does the patient have difficulty seeing, even when wearing glasses/contacts?: No Does the patient have difficulty concentrating, remembering, or making decisions?: Yes Patient able to express need for assistance with ADLs?: Yes Does the patient have difficulty dressing or bathing?: Yes Independently performs ADLs?: Yes (appropriate for developmental age) Does the patient have difficulty walking or climbing stairs?: Yes Weakness of Legs: Both Weakness of Arms/Hands: None  Permission Sought/Granted Permission sought to share information with : Case Manager, Other (comment) Permission granted to share information with : Yes, Verbal Permission Granted     Permission granted to share info w AGENCY: home health agency- [facility]        Emotional Assessment Appearance:: Appears stated age Attitude/Demeanor/Rapport: Engaged Affect (typically observed): Accepting Orientation: : Oriented to Self, Oriented to Place, Oriented to  Time, Oriented to Situation Alcohol / Substance Use: Not Applicable Psych Involvement: No (comment)  Admission diagnosis:  Dehydration [W41.3] Chronic systolic congestive heart failure (HCC) [I50.22] AKI (acute kidney injury) ([place]) [N17.9] Patient Active Problem List   Diagnosis Date Noted  . AKI (acute kidney  injury) ([place]) 11/28/2018  . Hyperlipidemia 09/13/2017  . Obstructive sleep apnea 09/13/2017  . CHF (congestive heart failure) ([place]) 06/11/2017  . Diabetes mellitus ([place]) 06/11/2017  . Hypertension  06/11/2017  . ICD (implantable cardioverter-defibrillator) in place 06/11/2017  . Orthostatic hypotension 06/11/2017  . Syncope 06/11/2017   PCP:  [facility], [place], [place] [address] Phone: [phone] Fax: [phone]     Social Determinants of Health (SDOH) Interventions    Readmission Risk Interventions No flowsheet data found.

## 2018-12-02 NOTE — Consult Note (Signed)
PHARMACY CONSULT NOTE - FOLLOW UP  Pharmacy Consult for Electrolyte Monitoring and Replacement   Recent Labs: Potassium (mmol/L)  Date Value  12/02/2018 3.7  05/19/2012 5.3 (H)   Magnesium (mg/dL)  Date Value  12/02/2018 2.7 (H)   Calcium (mg/dL)  Date Value  12/02/2018 9.0   Calcium, Total (mg/dL)  Date Value  05/19/2012 8.3 (L)   Albumin (g/dL)  Date Value  12/02/2018 3.3 (L)   Phosphorus (mg/dL)  Date Value  12/02/2018 3.1   Sodium (mmol/L)  Date Value  12/02/2018 139  05/19/2012 139     Assessment: Pharmacy has been consulted to monitor and replace Electrolytes in 78yo patient presenting with AKI.  Goal of Therapy:  Electrolytes WNL  Plan:  No additional replacement needed at this time.  Will order repeat labs tomorrow with AM labs.  Rocky Morel ,PharmD, BCPS Clinical Pharmacist 12/02/2018 8:16 AM

## 2018-12-02 NOTE — Discharge Summary (Signed)
[facility] at [facility], 78 y.o., DOB [birth year], MRN [id number]. Admission date: 11/28/2018 Discharge Date 12/02/2018 Primary MD Center, [place] Physician Nicholes Mango, MD  Admission Diagnosis  Dehydration [id number] Chronic systolic congestive heart failure (HCC) [I50.22] AKI (acute kidney injury) ([place]) [N17.9]  Discharge Diagnosis   Active Problems: Acute kidney injury on chronic kidney disease stage III Syncope Chronic CHF type unknown currently compensated Syncope related to dehydration Generalized weakness Obstructive sleep apnea Diabetes type 2 Essential hypertension Hyperlipidemia    Hospital Course Trevor Jennings  is a 78 y.o. male with a known history of congestive heart failure, COPD, diabetes mellitus, hypertension hyperlipidemia chronic kidney disease status post defibrillator is presenting to the ED after he sustained a syncopal episode and feeling extremely weak.  Patient has baseline chronic kidney disease and today his creatinine is at 4.6.  Patient usually goes to [facility] and recently he was admitted with signs of congestive heart failure and he was given metolazone.  Patient exactly does not know what happened at around 2:57 PM but he had a syncopal episode.    Patient was noted to have acute renal failure on chronic kidney disease.  He was hospitalized recently and had significant fluid removal.  Patient's was admitted to our hospital and was given gentle IV fluids.  He was seen in consultation by nephrology who agreed with the plan.  Patient's renal function is close to baseline.  I have recommended patient to follow-up with his primary care provider and his primary nephrologist at [facility] he needs a repeat BMP, follow-up.  Also I am holding his metolazone but continue all his other diuretics.  He will need a close outpatient follow-up and monitoring of action.             Consults   cardiology  Significant Tests:  See full reports for all details     Dg Chest 2 View  Result Date: 11/28/2018 CLINICAL DATA:  Weakness x1 week, worse today. Hx of CHF, COPD, diabetes, HTN. Former smoker. EXAM: CHEST - 2 VIEW COMPARISON:  It is 09/13/2017 FINDINGS: LEFT-sided transvenous pacemaker leads overlie the RIGHT atrium, RIGHT ventricle, and coronary sinus. The heart is enlarged and stable in configuration. No pulmonary edema. No focal consolidations. Below fragments overlying the LEFT humeral head. There are extensive degenerative changes in the midthoracic spine. IMPRESSION: Stable cardiomegaly. No evidence for acute pulmonary abnormality. Electronically Signed   By: Nolon Nations M.D.   On: 11/28/2018 17:42   US Renal  Result Date: 11/29/2018 CLINICAL DATA:  Acute kidney injury EXAM: RENAL / URINARY TRACT ULTRASOUND COMPLETE COMPARISON:  None. FINDINGS: Right Kidney: Renal measurements: 9.6 x 6.0 x 5.0 cm = volume: 152 mL. 2 cm exophytic cyst off the midpole. No hydronephrosis. Normal echotexture. Left Kidney: Renal measurements: 10.6 x 5.2 x 5.4 cm = volume: 154 mL. Echogenicity within normal limits. No mass or hydronephrosis visualized. Bladder: Appears normal for degree of bladder distention. Other: None IMPRESSION: No acute findings.  No hydronephrosis. Electronically Signed   By: Rolm Baptise M.D.   On: 11/29/2018 08:54       Today   Subjective:   Trevor Jennings patient doing better shortness of breath improved Objective:   Blood pressure (!) 111/47, pulse 60, temperature 98 F (36.7 C), temperature source Oral, resp. rate 20, height 5\' 10"  (1.778 m), weight 130.5 kg, SpO2 99 %.  .  Intake/Output Summary (Last 24 hours) at 12/02/2018 1032 Last  data filed at 12/02/2018 0900 Gross per 24 hour  Intake 480 ml  Output 425 ml  Net 55 ml    Exam VITAL SIGNS: Blood pressure (!) 111/47, pulse 60, temperature 98 F (36.7 C), temperature source Oral, resp. rate 20, height  5\' 10"  (1.778 m), weight 130.5 kg, SpO2 99 %.  GENERAL:  78 y.o.-year-old patient lying in the bed with no acute distress.  EYES: Pupils equal, round, reactive to light and accommodation. No scleral icterus. Extraocular muscles intact.  HEENT: Head atraumatic, normocephalic. Oropharynx and nasopharynx clear.  NECK:  Supple, no jugular venous distention. No thyroid enlargement, no tenderness.  LUNGS: Normal breath sounds bilaterally, no wheezing, rales,rhonchi or crepitation. No use of accessory muscles of respiration.  CARDIOVASCULAR: S1, S2 normal. No murmurs, rubs, or gallops.  ABDOMEN: Soft, nontender, nondistended. Bowel sounds present. No organomegaly or mass.  EXTREMITIES: No pedal edema, cyanosis, or clubbing.  NEUROLOGIC: Cranial nerves II through XII are intact. Muscle strength 5/5 in all extremities. Sensation intact. Gait not checked.  PSYCHIATRIC: The patient is alert and oriented x 3.  SKIN: No obvious rash, lesion, or ulcer.   Data Review     CBC w Diff:  Lab Results  Component Value Date   WBC 7.6 11/29/2018   HGB 10.9 (L) 11/29/2018   HGB 11.6 (L) 05/19/2012   HCT 39.1 11/29/2018   HCT 37.5 (L) 05/19/2012   PLT 217 11/29/2018   PLT 222 05/19/2012   LYMPHOPCT 22 11/28/2018   MONOPCT 11 11/28/2018   EOSPCT 1 11/28/2018   BASOPCT 1 11/28/2018   CMP:  Lab Results  Component Value Date   NA 139 12/02/2018   NA 139 05/19/2012   K 3.7 12/02/2018   K 5.3 (H) 05/19/2012   CL 95 (L) 12/02/2018   CL 108 (H) 05/19/2012   CO2 30 12/02/2018   CO2 28 05/19/2012   BUN 70 (H) 12/02/2018   BUN 14 05/19/2012   CREATININE 3.22 (H) 12/02/2018   CREATININE 0.97 05/19/2012   PROT 7.9 11/29/2018   ALBUMIN 3.3 (L) 12/02/2018   BILITOT 0.8 11/29/2018   ALKPHOS 92 11/29/2018   AST 21 11/29/2018   ALT 25 11/29/2018  .  Micro Results Recent Results (from the past 240 hour(s))  SARS Coronavirus 2 by RT PCR (hospital order, performed in [facility] hospital lab)  Nasopharyngeal Nasopharyngeal Swab     Status: None   Collection Time: 11/28/18  6:23 PM   Specimen: Nasopharyngeal Swab  Result Value Ref Range Status   SARS Coronavirus 2 NEGATIVE NEGATIVE Final    Comment: (NOTE) If result is NEGATIVE SARS-CoV-2 target nucleic acids are NOT DETECTED. The SARS-CoV-2 RNA is generally detectable in upper and lower  respiratory specimens during the acute phase of infection. The lowest  concentration of SARS-CoV-2 viral copies this assay can detect is 250  copies / mL. A negative result does not preclude SARS-CoV-2 infection  and should not be used as the sole basis for treatment or other  patient management decisions.  A negative result may occur with  improper specimen collection / handling, submission of specimen other  than nasopharyngeal swab, presence of viral mutation(s) within the  areas targeted by this assay, and inadequate number of viral copies  (<250 copies / mL). A negative result must be combined with clinical  observations, patient history, and epidemiological information. If result is POSITIVE SARS-CoV-2 target nucleic acids are DETECTED. The SARS-CoV-2 RNA is generally detectable in upper and lower  respiratory  specimens dur ing the acute phase of infection.  Positive  results are indicative of active infection with SARS-CoV-2.  Clinical  correlation with patient history and other diagnostic information is  necessary to determine patient infection status.  Positive results do  not rule out bacterial infection or co-infection with other viruses. If result is PRESUMPTIVE POSTIVE SARS-CoV-2 nucleic acids MAY BE PRESENT.   A presumptive positive result was obtained on the submitted specimen  and confirmed on repeat testing.  While 2019 novel coronavirus  (SARS-CoV-2) nucleic acids may be present in the submitted sample  additional confirmatory testing may be necessary for epidemiological  and / or clinical management purposes  to  differentiate between  SARS-CoV-2 and other Sarbecovirus currently known to infect humans.  If clinically indicated additional testing with an alternate test  methodology [phone]) is advised. The SARS-CoV-2 RNA is generally  detectable in upper and lower respiratory sp ecimens during the acute  phase of infection. The expected result is Negative. Fact Sheet for Patients:  StrictlyIdeas.no Fact Sheet for Healthcare Providers: BankingDealers.co.za This test is not yet approved or cleared by the [place] FDA and has been authorized for detection and/or diagnosis of SARS-CoV-2 by FDA under an Emergency Use Authorization (EUA).  This EUA will remain in effect (meaning this test can be used) for the duration of the COVID-19 declaration under Section 564(b)(1) of the Act, 21 U.S.C. section 360bbb-3(b)(1), unless the authorization is terminated or revoked sooner. Performed at [facility], [address] [place], [place] [address]         Code Status Orders  (From admission, onward)         Start     Ordered   11/28/18 2219  Full code  Continuous     11/28/18 2218        Code Status History    This patient has a current code status but no historical code status.   Advance Care Planning Activity    Advance Directive Documentation     Most Recent Value  Type of Advance Directive  Healthcare Power of Attorney, Living will  Pre-existing out of facility DNR order (yellow form or pink MOST form)  -  "MOST" Form in Place?  -          Follow-up Information    Keplinger, Rudi Rummage, MD. Call in 6 days.   Specialty: Internal Medicine Why: NURSE JEFF WILL CALL YOU AT HOME WITH APPOINTMET  Contact information: [place] [address] [phone]           Discharge Medications   Allergies as of 12/02/2018      Reactions   Penicillins       Medication List    STOP taking these medications    metolazone 2.5 MG tablet Commonly known as: ZAROXOLYN     TAKE these medications   albuterol 108 (90 Base) MCG/ACT inhaler Commonly known as: VENTOLIN HFA Inhale into the lungs.   amiodarone 200 MG tablet Commonly known as: PACERONE Take 400 mg by mouth daily.   aspirin EC 81 MG tablet Take by mouth.   atorvastatin 80 MG tablet Commonly known as: LIPITOR Take by mouth.   chlorhexidine 0.12 % solution Commonly known as: PERIDEX Use as directed 15 mLs in the mouth or throat 2 (two) times daily.   colchicine 0.6 MG tablet Take 0.6 mg by mouth daily. Take 2 tablets at onset of gout attack, then take 1 tablet by mouth daily.  fluticasone 50 MCG/ACT nasal spray Commonly known as: FLONASE 2 sprays by Each Nare route daily.   hydroxypropyl methylcellulose / hypromellose 2.5 % ophthalmic solution Commonly known as: ISOPTO TEARS / GONIOVISC Place 1 drop into both eyes 4 (four) times daily.   Lantus 100 UNIT/ML injection Generic drug: insulin glargine Inject 40 Units into the skin at bedtime.   liraglutide 18 MG/3ML Sopn Commonly known as: VICTOZA Inject 1.2 mg into the skin daily.   loratadine 10 MG tablet Commonly known as: CLARITIN Take 10 mg by mouth daily.   metoprolol tartrate 25 MG tablet Commonly known as: LOPRESSOR Take 25 mg by mouth 2 (two) times daily.   nitroGLYCERIN 0.4 MG SL tablet Commonly known as: NITROSTAT Place 0.4 mg under the tongue every 5 (five) minutes as needed for chest pain. Take 1 tablet as needed for chest pain. Do not exceed 3 tabs/15 minutes.   pantoprazole 40 MG tablet Commonly known as: PROTONIX Take by mouth.   sennosides-docusate sodium 8.6-50 MG tablet Commonly known as: SENOKOT-S Take 1 tablet by mouth 2 (two) times daily.   sertraline 100 MG tablet Commonly known as: ZOLOFT Take 50 mg by mouth daily.   spironolactone 25 MG tablet Commonly known as: ALDACTONE Take 12.5 mg by mouth daily.   torsemide 20 MG  tablet Commonly known as: DEMADEX Take 80 mg by mouth 2 (two) times daily.   Vitamin D-1000 Max St 25 MCG (1000 UT) tablet Generic drug: Cholecalciferol Take by mouth.          Total Time in preparing paper work, data evaluation and todays exam - 23 minutes  Dustin Flock M.D on 12/02/2018 at 10:32 AM [place]  [phone]

## 2018-12-02 NOTE — Progress Notes (Signed)
Patient discharged home with family. Discharge instructions reviewed, no questions. [name]

## 2018-12-02 NOTE — Care Management Important Message (Signed)
Important Message  Patient Details  Name: Trevor Jennings MRN: [id number] Date of Birth: November 15, 1940   Medicare Important Message Given:  Yes     Juliann Pulse A Doshie Maggi 12/02/2018, 11:58 AM

## 2018-12-02 NOTE — TOC Transition Note (Signed)
Transition of Care [facility]) - CM/SW Discharge Note   Patient Details  Name: Aubree Llorens MRN: [id number] Date of Birth: [birth year]  [facility] CM/SW Contact:  Shelbie Hutching, RN Phone Number: 12/02/2018, 9:30 AM   Clinical Narrative:     Discharge today with home health services.  Wife to provide transportation home.   Final next level of care: Home w Home Health Services Barriers to Discharge: No Barriers Identified   Patient Goals and CMS Choice   CMS Medicare.gov Compare Post Acute Care list provided to:: Patient Choice offered to / list presented to : Patient  Discharge Placement                       Discharge Plan and Services   Discharge Planning Services: CM Consult Post Acute Care Choice: Resumption of Svcs/PTA Provider                    HH Arranged: PT, Nurse's Aide HH Agency: [place] Date [place]: 12/02/18 Time [place]: [phone] Representative spoke with at [place]: [place] ([place]) Interventions     Readmission Risk Interventions No flowsheet data found.

## 2019-09-30 ENCOUNTER — Emergency Department
Admission: EM | Admit: 2019-09-30 | Discharge: 2019-10-08 | Disposition: E | Payer: No Typology Code available for payment source | Attending: Emergency Medicine | Admitting: Emergency Medicine

## 2019-09-30 DIAGNOSIS — Z87891 Personal history of nicotine dependence: Secondary | ICD-10-CM | POA: Insufficient documentation

## 2019-09-30 DIAGNOSIS — I469 Cardiac arrest, cause unspecified: Secondary | ICD-10-CM | POA: Insufficient documentation

## 2019-09-30 DIAGNOSIS — E119 Type 2 diabetes mellitus without complications: Secondary | ICD-10-CM | POA: Diagnosis not present

## 2019-09-30 DIAGNOSIS — I509 Heart failure, unspecified: Secondary | ICD-10-CM | POA: Insufficient documentation

## 2019-09-30 DIAGNOSIS — Z951 Presence of aortocoronary bypass graft: Secondary | ICD-10-CM | POA: Insufficient documentation

## 2019-09-30 DIAGNOSIS — J449 Chronic obstructive pulmonary disease, unspecified: Secondary | ICD-10-CM | POA: Diagnosis not present

## 2019-09-30 DIAGNOSIS — I110 Hypertensive heart disease with heart failure: Secondary | ICD-10-CM | POA: Insufficient documentation

## 2019-09-30 DIAGNOSIS — Z79899 Other long term (current) drug therapy: Secondary | ICD-10-CM | POA: Insufficient documentation

## 2019-09-30 MED FILL — Medication: Qty: 1 | Status: AC

## 2019-10-08 NOTE — ED Notes (Signed)
EDP Malinda talking with pt's wife who has come to ED.

## 2019-10-08 NOTE — Progress Notes (Signed)
Ch visited with Pt's family after being paged to the ED. Ch met with Pt's wife and daughter in the family consult room . RN let Ch know in the Pt's room that Pt had passed. Ch accompanied MD and RN while MD let family know. Ch escorted family to Pt's room, wife distraught. Ch helped family complete funeral release form, and also escorted family out of the ED. Family was grateful for support.

## 2019-10-08 NOTE — ED Provider Notes (Addendum)
[facility] Emergency Department Provider Note   ____________________________________________   First MD Initiated Contact with Patient 10/26/2019 1247     (approximate)  I have reviewed the triage vital signs and the nursing notes.   HISTORY  Chief Complaint No chief complaint on file.  Chief complaint is CPR in progress history limited by CPR in progress  HPI Trevor Jennings is a 79 y.o. male EMS reports patient was on the way to the [place] for routine blood draw.  His wife got him in the car and 1 turned the air conditioner on and went back in the house per minute when she came out he was unresponsive.  EMS arrived and began CPR.  He got 3 epi's in 1 bicarb was intubated.  CO2 went up.  Unfortunately were unable to find a pulse.  He had good complexes.  He came to the emergency room for evaluation.  CPR remained in progress.  He got 1 epi and 1 bicarb here but repeated bedside ultrasound of the heart showed absolutely no cardiac activity in spite of a good waveform on the monitor.  Patient was in PEA that is unresponsive.  Called the code at 1242.         Past Medical History:  Diagnosis Date  . CHF (congestive heart failure) ([place])   . COPD (chronic obstructive pulmonary disease) ([place])   . Diabetes mellitus without complication ([place])   . Hx of CABG 2003  . Hyperlipemia   . Hypertension   . Kidney disease   . 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 AKI (acute kidney injury) ([place]) 11/28/2018  . Hyperlipidemia 09/13/2017  . Obstructive sleep apnea 09/13/2017  . CHF (congestive heart failure) ([place]) 06/11/2017  . Diabetes mellitus ([place]) 06/11/2017  . Hypertension 06/11/2017  . ICD (implantable cardioverter-defibrillator) in place 06/11/2017  . Orthostatic hypotension 06/11/2017  . Syncope 06/11/2017    Past Surgical History:  Procedure Laterality Date  . 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 Authorizing Provider  albuterol (PROVENTIL HFA;VENTOLIN HFA) 108 (90 Base) MCG/ACT inhaler Inhale into the lungs.    [provider]  amiodarone (PACERONE) 200 MG tablet Take 400 mg by mouth daily.    [provider]  aspirin EC 81 MG tablet Take by mouth.    [provider]  atorvastatin (LIPITOR) 80 MG tablet Take by mouth.    [provider]  chlorhexidine (PERIDEX) 0.12 % solution Use as directed 15 mLs in the mouth or throat 2 (two) times daily.    [provider]  Cholecalciferol (VITAMIN D-1000 MAX ST) 1000 units tablet Take by mouth.    [provider]  colchicine 0.6 MG tablet Take 0.6 mg by mouth daily. Take 2 tablets at onset of gout attack, then take 1 tablet by mouth daily.    [provider]  fluticasone (FLONASE) 50 MCG/ACT nasal spray 2 sprays by Each Nare route daily.    [provider]  hydroxypropyl methylcellulose / hypromellose (ISOPTO TEARS / GONIOVISC) 2.5 % ophthalmic solution Place 1 drop into both eyes 4 (four) times daily.    [provider]  insulin glargine (LANTUS) 100 UNIT/ML injection Inject 40 Units into the skin at bedtime.     [provider]  liraglutide (VICTOZA) 18 MG/3ML SOPN Inject 1.2  mg into the skin daily.    [provider]  loratadine (CLARITIN) 10 MG tablet Take 10 mg by mouth daily.    [provider]  metoprolol tartrate (LOPRESSOR) 25 MG tablet Take 25 mg by mouth 2 (two) times daily.    [provider]  nitroGLYCERIN (NITROSTAT) 0.4 MG SL tablet Place 0.4 mg under the tongue every 5 (five) minutes as needed for chest pain. Take 1 tablet as needed for chest pain. Do not exceed 3 tabs/15 minutes.    [provider]  pantoprazole (PROTONIX) 40 MG tablet Take by mouth.    [provider]  sennosides-docusate  sodium (SENOKOT-S) 8.6-50 MG tablet Take 1 tablet by mouth 2 (two) times daily.    [provider]  sertraline (ZOLOFT) 100 MG tablet Take 50 mg by mouth daily.    [provider]  spironolactone (ALDACTONE) 25 MG tablet Take 12.5 mg by mouth daily.    [provider]  torsemide (DEMADEX) 20 MG tablet Take 80 mg by mouth 2 (two) times daily.     [provider]    Allergies Penicillins  No family history on file.  Social History Social History   Tobacco Use  . Smoking status: Former Smoker    Quit date: 1995    Years since quitting: 26.6  . Smokeless tobacco: Never Used  Vaping Use  . Vaping Use: Never used  Substance Use Topics  . Alcohol use: Never  . Drug use: Never    Review of Systems  Unable to obtain  ____________________________________________   PHYSICAL EXAM:  VITAL SIGNS: ED Triage Vitals  Enc Vitals Group     BP      Pulse      Resp      Temp      Temp src      SpO2      Weight      Height      Head Circumference      Peak Flow      Pain Score      Pain Loc      Pain Edu?      Excl. in [place]?     Constitutional: Unresponsive Eyes: Pupils fixed midposition Head: Atraumatic. Nose: No congestion/rhinnorhea. Mouth/Throat: Mucous membranes are moist.  Neck: No stridor. Cardiovascular good waveform on the monitor no palpable pulse even with CPR Respiratory: No respiratory effort Gastrointestinal: Soft and nontender. Musculoskeletal: No lower extremity tenderness nor edema.   Neurologic: Unresponsive Skin:  Skin is warm, dry and intact. No rash noted.   ____________________________________________   LABS (all labs ordered are listed, but only abnormal results are displayed)  Labs Reviewed - No data to display ____________________________________________  EKG No EKG done PEA on the monitor  ____________________________________________  RADIOLOGY  ED MD interpretation:    Official radiology  report(s): No results found.  ____________________________________________   PROCEDURES  Procedure(s) performed (including Critical Care):  Procedures   ____________________________________________   INITIAL IMPRESSION / ASSESSMENT AND PLAN / ED COURSE                ____________________________________________   FINAL CLINICAL IMPRESSION(S) / ED DIAGNOSES  Final diagnoses:  PEA (Pulseless electrical activity) [facility])     ED Discharge Orders    None       Note:  This document was prepared using Dragon voice recognition software and may include unintentional dictation errors.    Nena Polio, MD 2019/10/09 [place],  Regan Rakers, MD 10-04-2019 1357

## 2019-10-08 NOTE — ED Triage Notes (Addendum)
Code cart, IV cart, Glidoscope, [employer], [place], Rik kit, and IV pump with 2 channels at bedside before pt's arrival. Normajean Baxter, Rico Sheehan and this RN present. EDP Malinda and Paduchowski present. Caitlyn NT present. Pt in via [facility]. Pt was sitting in car with air on per wife. They had been on way to [facility] appointment. When she came back to car found pt unresponsive and called 911. History of CHF, COPD, MI, DM, HTN, PM & Defibrillator; no activity of PM or Defib noted per EMS; BG 139 and PCO2 98 with EMS; initially asystole with EMS then PEA; remained PEA upon arrival to ED; pt arrived to room at 12:30; 3 rounds of Epi, 1 dose of Narcan, and 1 of Sodium Bicarb given by EMS; CPR remained in progress upon arrival to room. Pt already intubated by EMS upon arrival to ED. Caitlyn NT took over CPR from EMS staff. Per EMS last Epi given at 12:37pm. I.O. placed in pt's R shin before arrival to ED. CPR started over 30 minutes ago in field per EMS. Epi given per verbal from [place] at 12:39. Sodium Bicarb given at 12:40 per EDP Malinda verbal order. 22g IV place in R hand by Theadora Rama, RN at 12:41. Pulse check including use of ultrasound by EDP Malinda and Paduchowski. EDP Malinda called TOD at 12:42. Pt's apple watch and wallet placed in belongings bag.

## 2019-10-08 NOTE — ED Notes (Signed)
Pt's belongings given to pt's wife. Chaplain at bedside while wife visits.

## 2019-10-08 DEATH — deceased

## 2020-07-05 IMAGING — CR DG CHEST 2V
2 series · 2 of 2 positions shown · non-contrast
Comparison: It is 09/13/2017

CLINICAL DATA: Weakness x1 week, worse today. Hx of CHF, COPD,
diabetes, HTN. Former smoker.

EXAM:
CHEST - 2 VIEW

[chest lat]
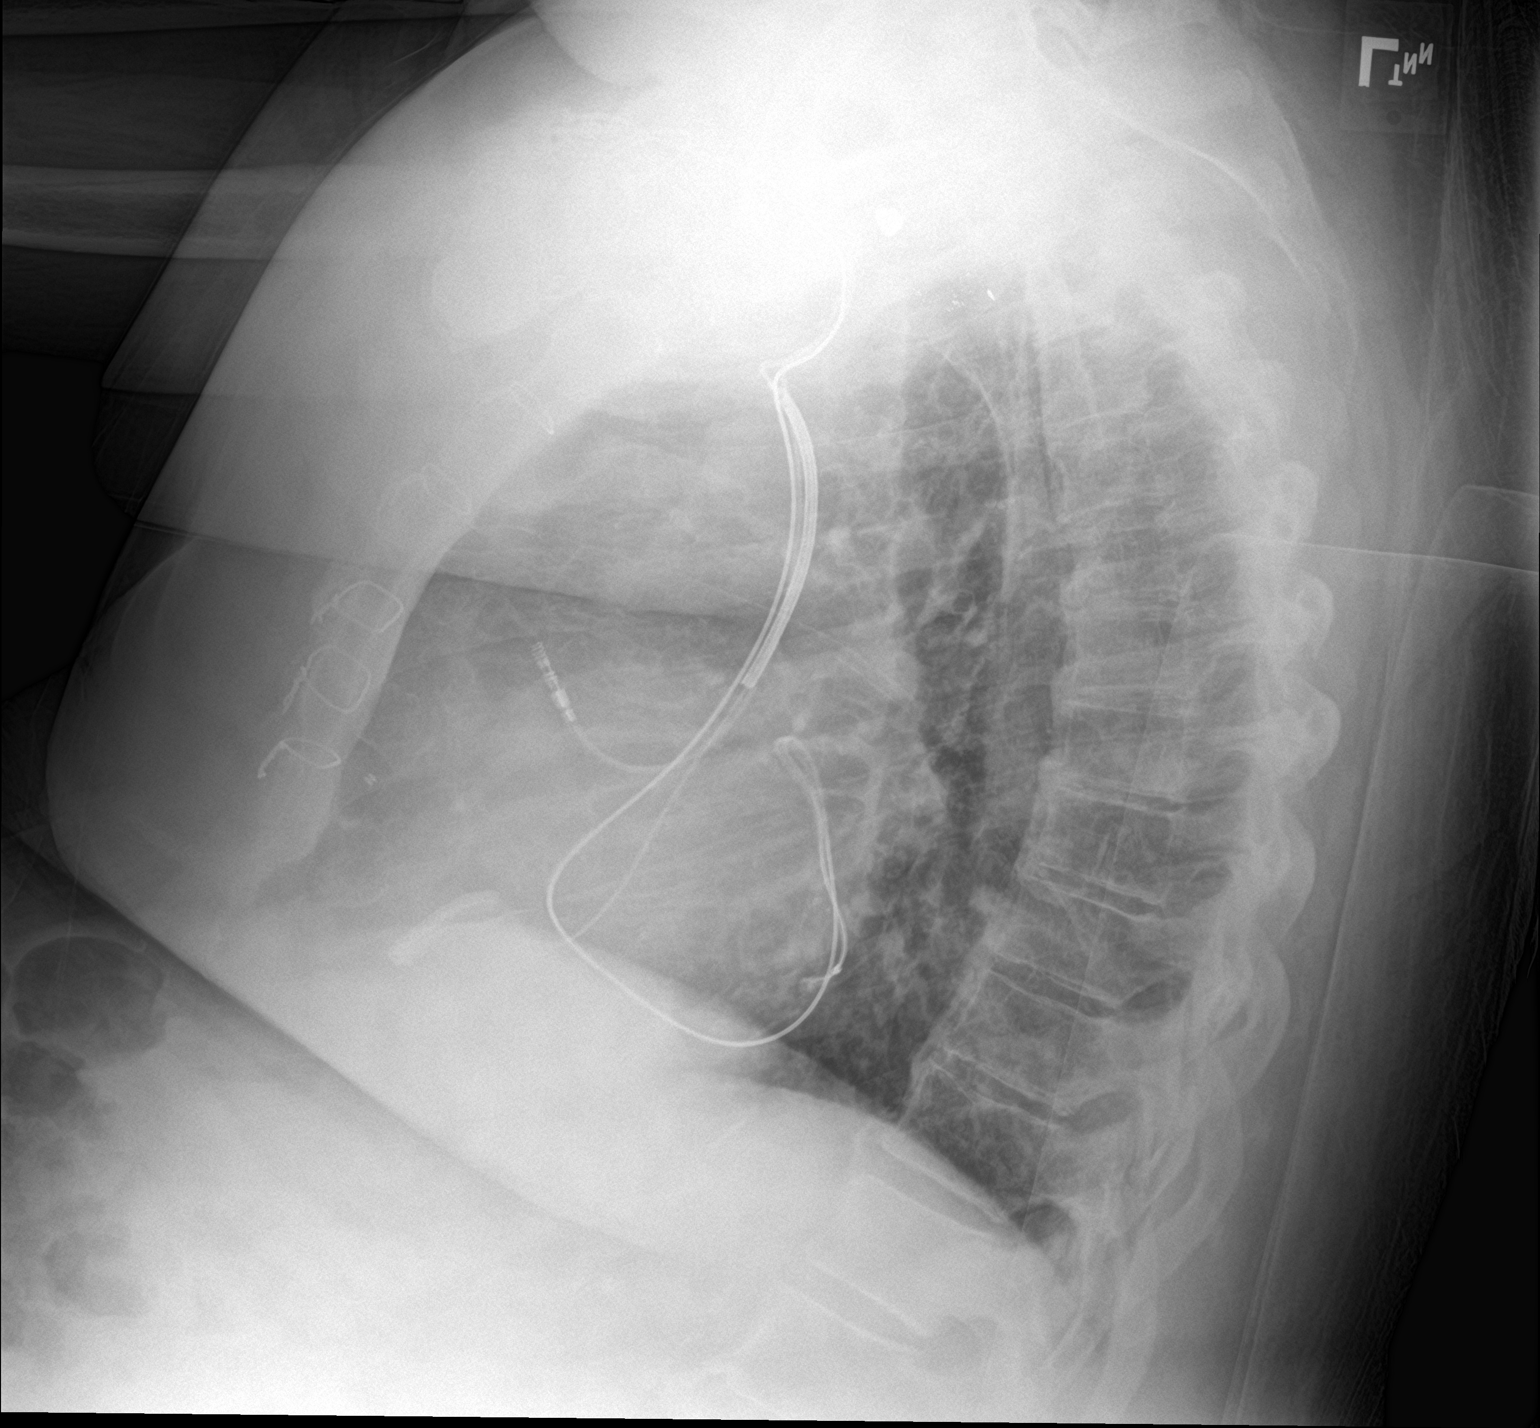

[chest ap]
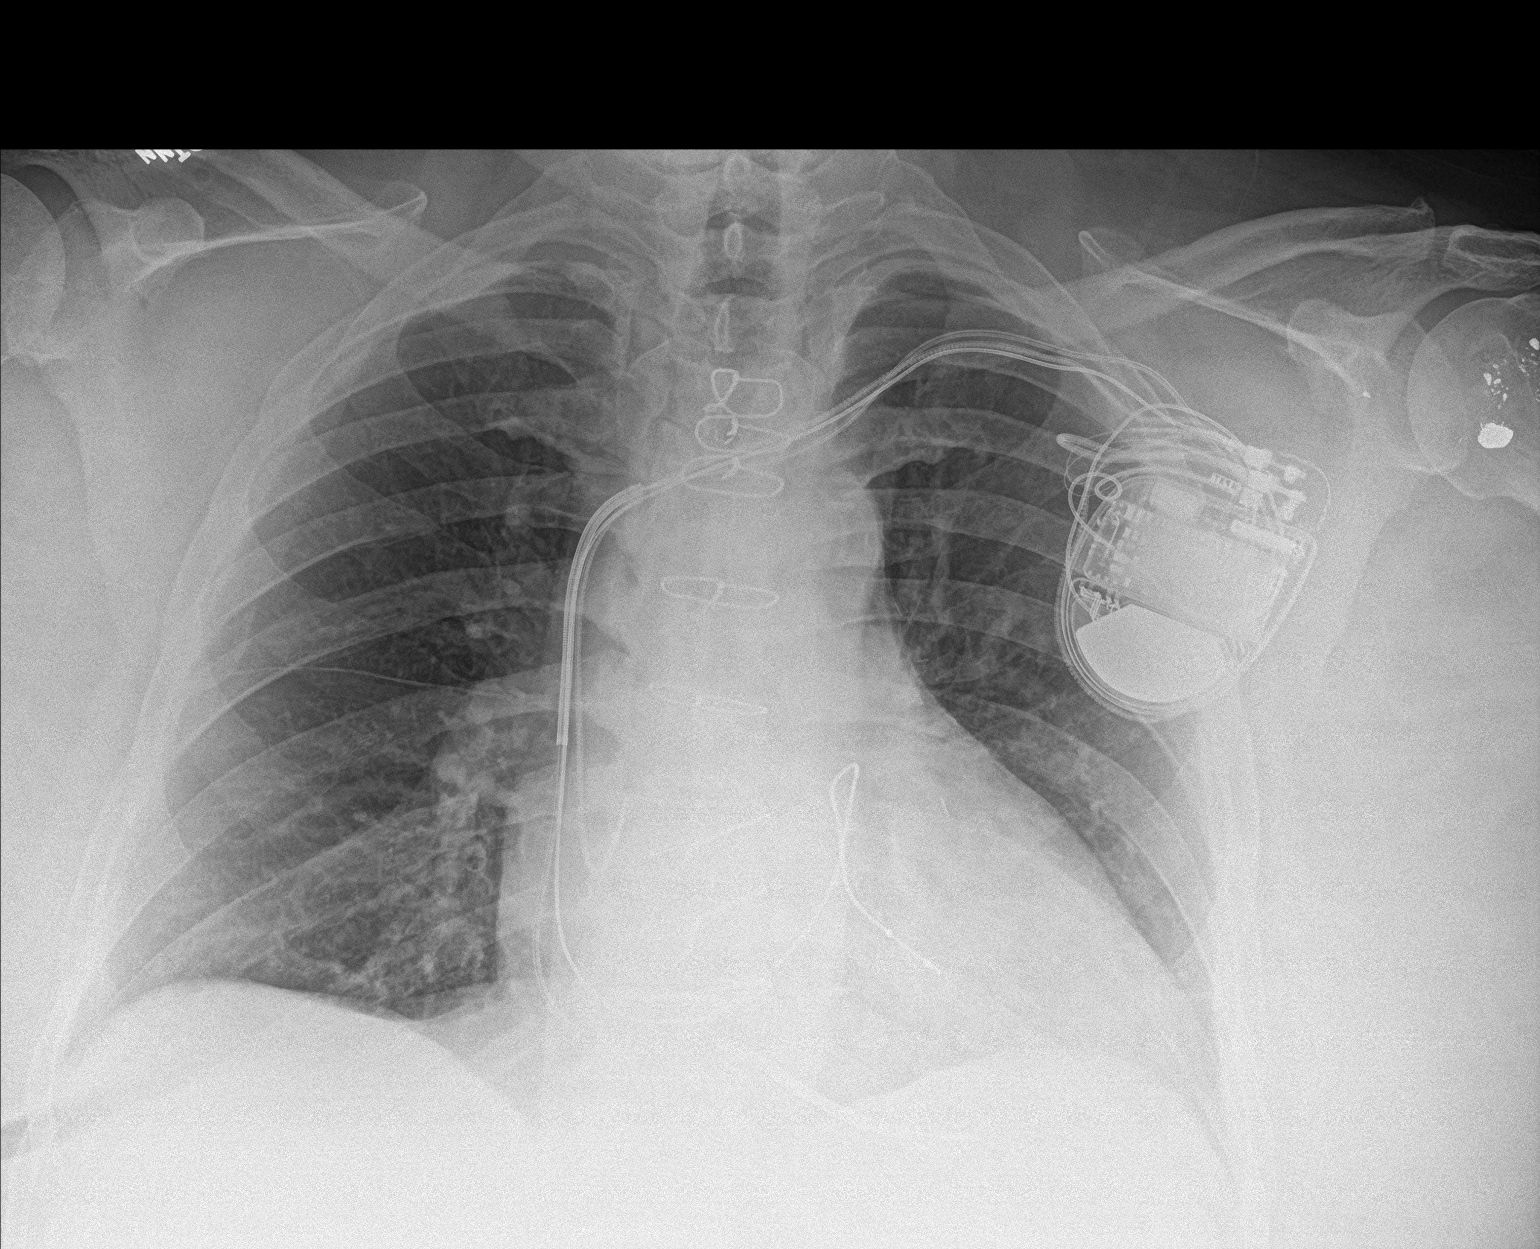

[2 of 2 positions shown; findings below may reference images not displayed]

FINDINGS: LEFT-sided transvenous pacemaker leads overlie the RIGHT atrium,
RIGHT ventricle, and coronary sinus. The heart is enlarged and
stable in configuration. No pulmonary edema. No focal
consolidations. Below fragments overlying the LEFT humeral head.
There are extensive degenerative changes in the midthoracic spine.
IMPRESSION: Stable cardiomegaly. No evidence for acute pulmonary abnormality.

## 2020-07-06 IMAGING — US US RENAL
1 series · 14 of 25 positions shown · non-contrast
Comparison: None.

CLINICAL DATA: Acute kidney injury

EXAM:
RENAL / URINARY TRACT ULTRASOUND COMPLETE

[Series 1: us renal · 14 of 36 slices shown]
[im 1/36]
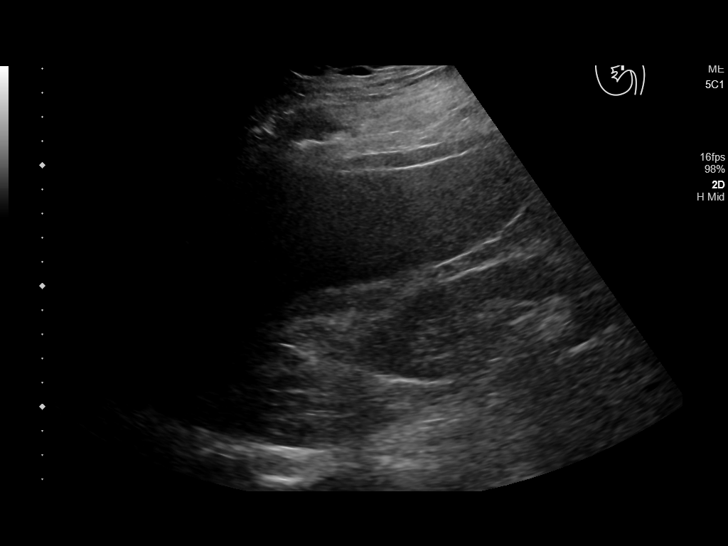
[im 3/36]
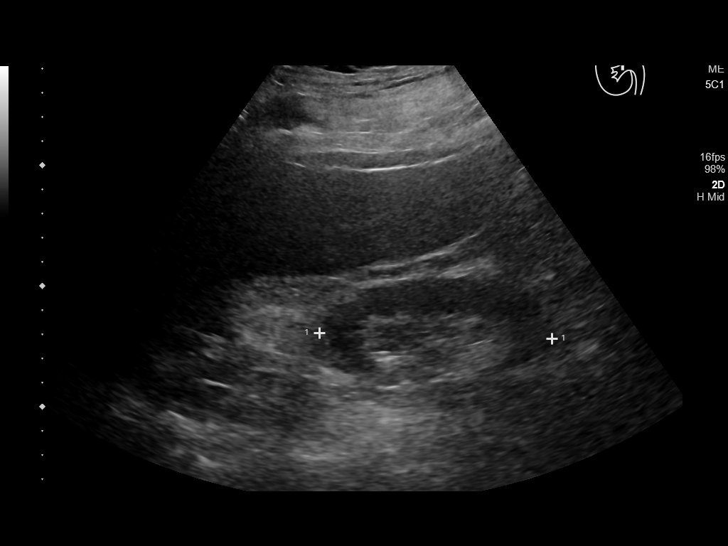
[im 6/36]
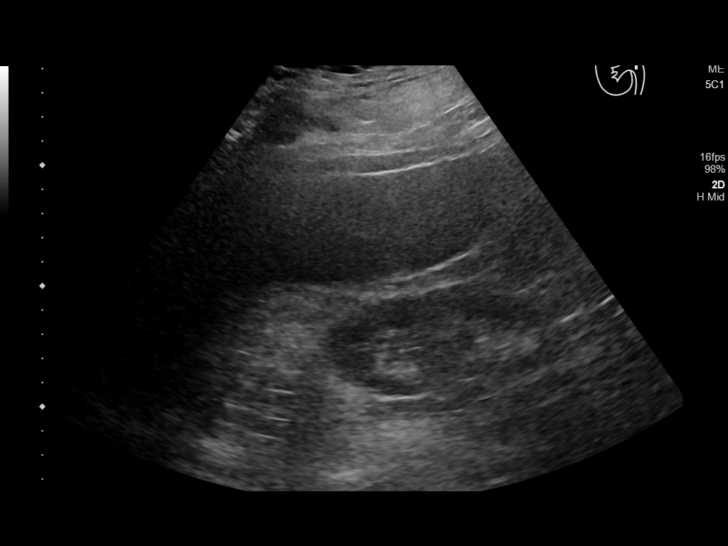
[im 9/36]
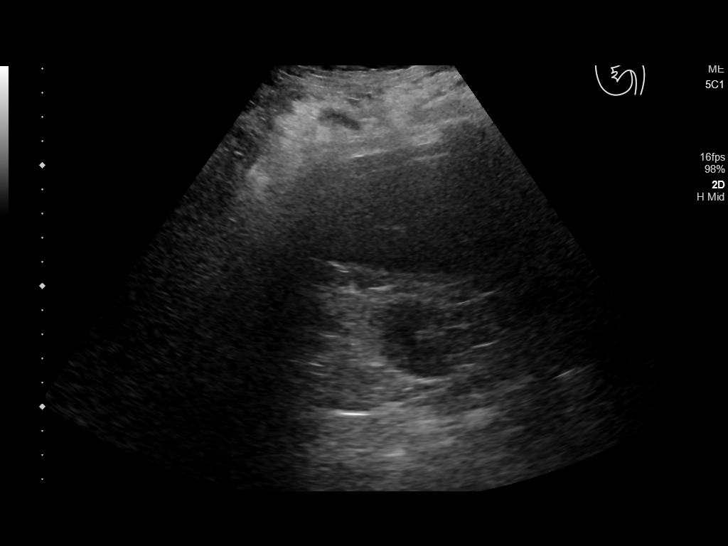
[im 12/36]
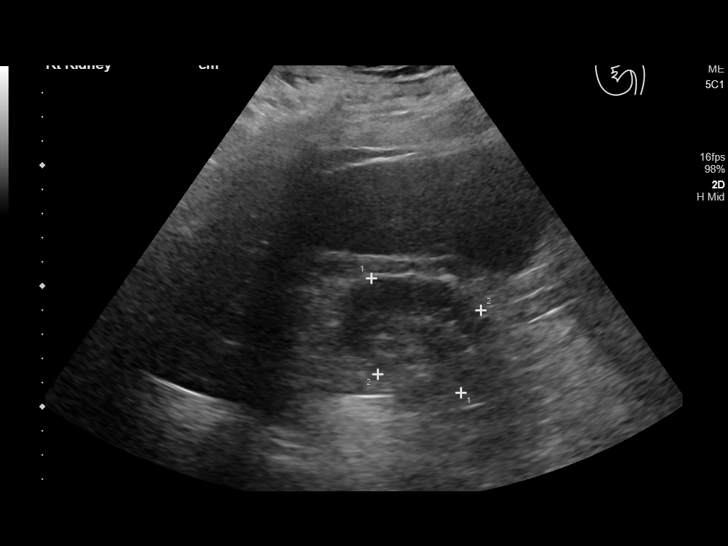
[im 14/36]
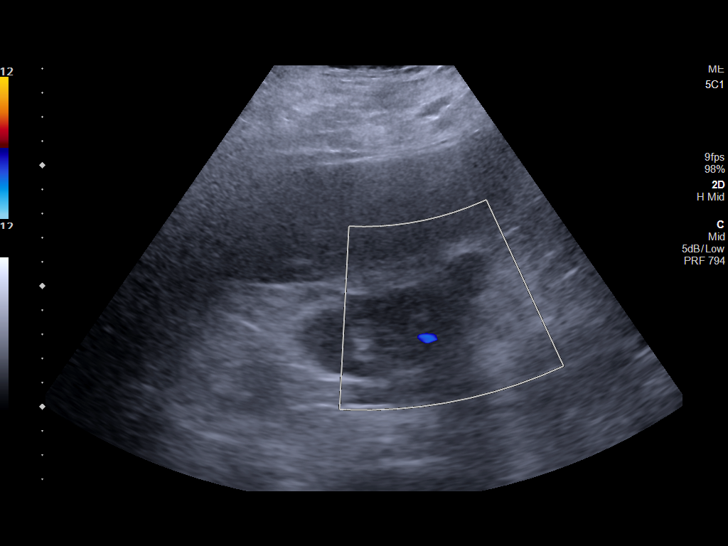
[im 17/36]
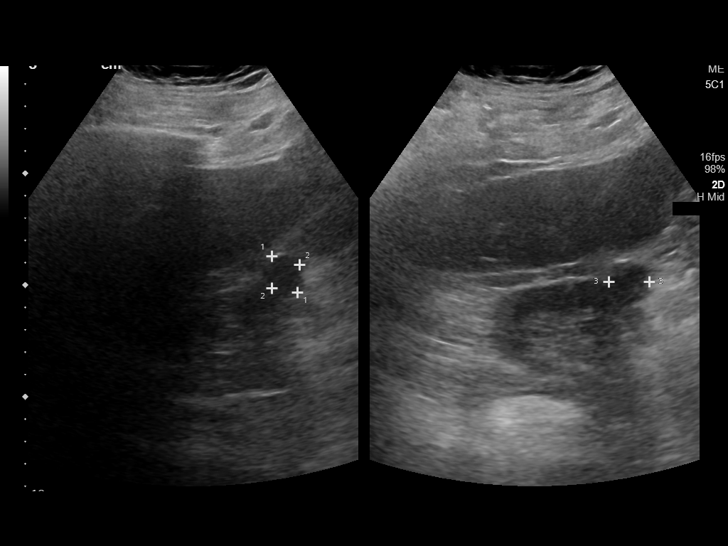
[im 19/36]
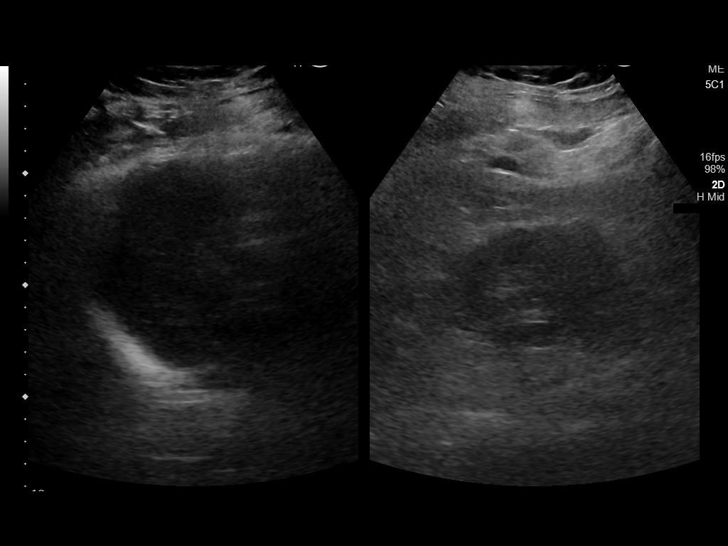
[im 22/36]
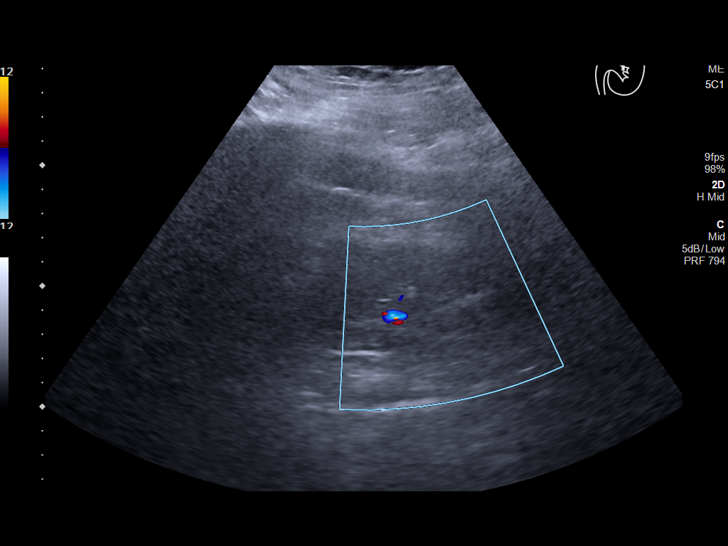
[im 24/36]
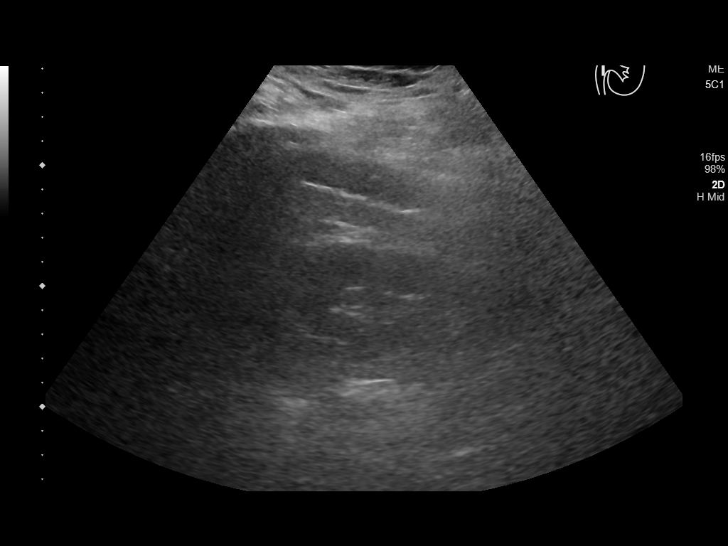
[im 27/36]
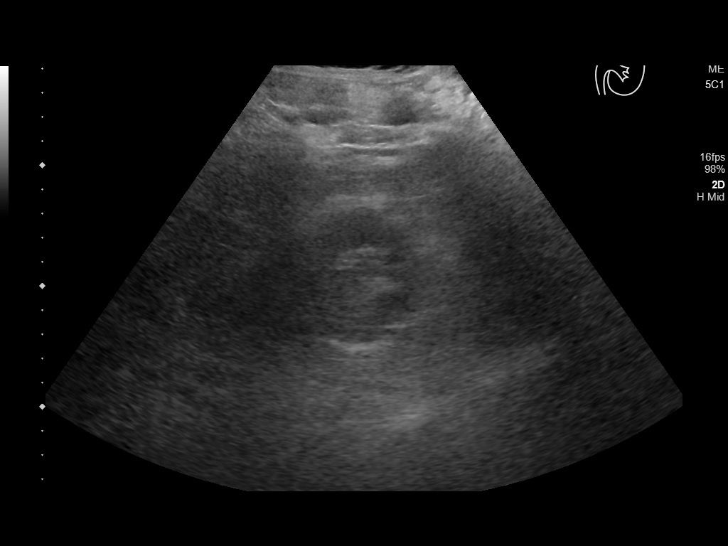
[im 30/36]
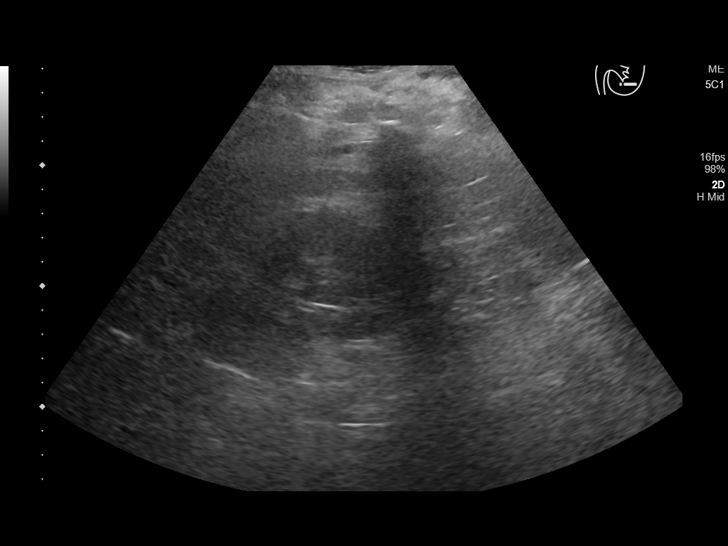
[im 33/36]
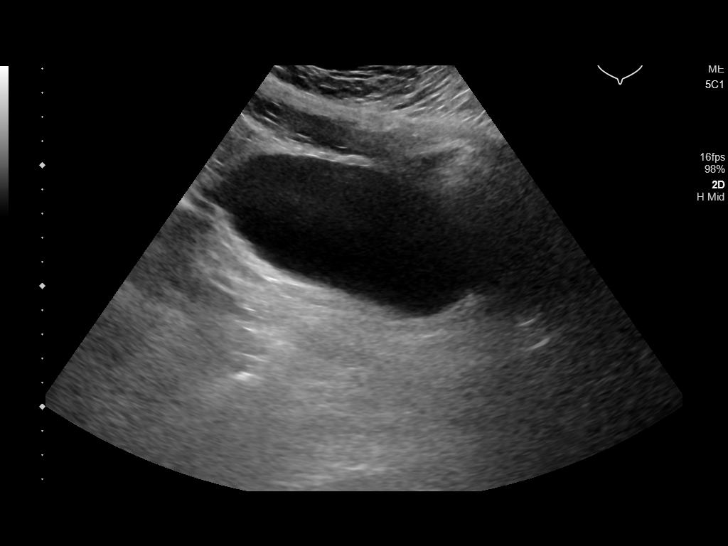
[im 36/36]
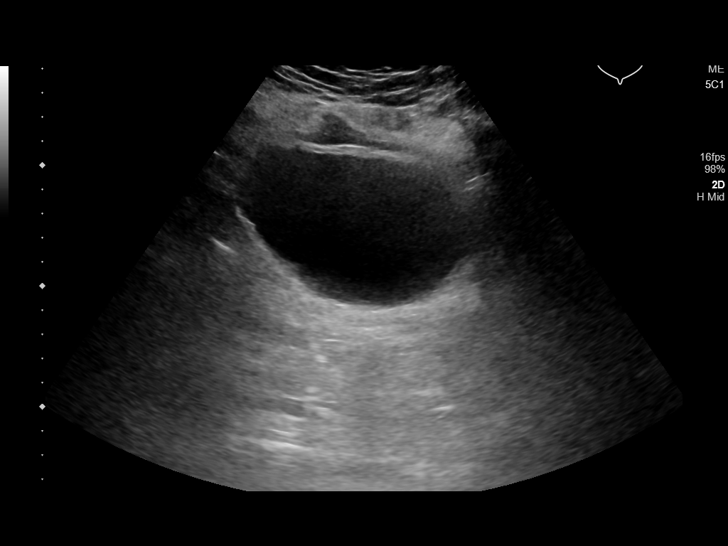

[14 of 25 positions shown; findings below may reference images not displayed]

FINDINGS: Right Kidney:

Renal measurements: 9.6 x 6.0 x 5.0 cm = volume: 152 mL. 2 cm
exophytic cyst off the midpole. No hydronephrosis. Normal
echotexture.

Left Kidney:

Renal measurements: 10.6 x 5.2 x 5.4 cm = volume: 154 mL.
Echogenicity within normal limits. No mass or hydronephrosis
visualized.

Bladder:

Appears normal for degree of bladder distention.

Other:

None
IMPRESSION: No acute findings.  No hydronephrosis.
